# Patient Record
Sex: Female | Born: 1965 | Race: Black or African American | Hispanic: No | Marital: Married | Smoking: Never smoker
Health system: Southern US, Community
[De-identification: ages and names within clinical notes are randomized; demographics above are authoritative.]

## PROBLEM LIST (undated history)

## (undated) DIAGNOSIS — Z803 Family history of malignant neoplasm of breast: Secondary | ICD-10-CM

## (undated) HISTORY — DX: Family history of malignant neoplasm of breast: Z80.3

## (undated) HISTORY — PX: OTHER SURGICAL HISTORY: SHX169

---

## 2007-09-12 ENCOUNTER — Emergency Department: Payer: Self-pay | Admitting: Emergency Medicine

## 2010-02-06 ENCOUNTER — Ambulatory Visit: Payer: Self-pay | Admitting: Unknown Physician Specialty

## 2010-02-11 ENCOUNTER — Ambulatory Visit: Payer: Self-pay | Admitting: Unknown Physician Specialty

## 2010-02-13 LAB — PATHOLOGY REPORT

## 2011-08-18 ENCOUNTER — Ambulatory Visit: Payer: Self-pay | Admitting: Family Medicine

## 2011-11-27 ENCOUNTER — Emergency Department: Payer: Self-pay | Admitting: Emergency Medicine

## 2011-12-10 ENCOUNTER — Emergency Department: Payer: Self-pay | Admitting: Emergency Medicine

## 2012-12-06 ENCOUNTER — Ambulatory Visit: Payer: Self-pay | Admitting: Family Medicine

## 2014-01-25 ENCOUNTER — Ambulatory Visit: Payer: Self-pay | Admitting: Family Medicine

## 2015-02-13 ENCOUNTER — Telehealth: Payer: Self-pay | Admitting: Gastroenterology

## 2015-02-13 NOTE — Telephone Encounter (Signed)
Colonoscopy triage °

## 2015-02-20 ENCOUNTER — Other Ambulatory Visit: Payer: Self-pay

## 2015-02-20 NOTE — Telephone Encounter (Signed)
Gastroenterology Pre-Procedure Review  Request Date: 03-14-15 Requesting Physician: Dr.   PATIENT REVIEW QUESTIONS: The patient responded to the following health history questions as indicated:    1. Are you having any GI issues? no 2. Do you have a personal history of Polyps? no 3. Do you have a family history of Colon Cancer or Polyps? no 4. Diabetes Mellitus? no 5. Joint replacements in the past 12 months?no 6. Major health problems in the past 3 months?no 7. Any artificial heart valves, MVP, or defibrillator?no    MEDICATIONS & ALLERGIES:    Patient reports the following regarding taking any anticoagulation/antiplatelet therapy:   Plavix, Coumadin, Eliquis, Xarelto, Lovenox, Pradaxa, Brilinta, or Effient? no Aspirin? no  Patient confirms/reports the following medications:  No current outpatient prescriptions on file.   No current facility-administered medications for this visit.    Patient confirms/reports the following allergies:  No Known Allergies  No orders of the defined types were placed in this encounter.    AUTHORIZATION INFORMATION Primary Insurance: 1D#: Group #:  Secondary Insurance: 1D#: Group #:  SCHEDULE INFORMATION: Date: 03-14-15 Time: Location: [place]

## 2015-03-12 ENCOUNTER — Ambulatory Visit
Admission: RE | Admit: 2015-03-12 | Discharge: 2015-03-12 | Disposition: A | Discharge status: Home / Self Care | Payer: BC Managed Care – PPO | Source: Ambulatory Visit | Attending: Advanced Practice Midwife | Admitting: Advanced Practice Midwife

## 2015-03-12 ENCOUNTER — Other Ambulatory Visit: Payer: Self-pay | Admitting: Advanced Practice Midwife

## 2015-03-12 ENCOUNTER — Ambulatory Visit: Admission: RE | Admit: 2015-03-12 | Payer: BC Managed Care – PPO | Source: Ambulatory Visit

## 2015-03-12 DIAGNOSIS — Z1231 Encounter for screening mammogram for malignant neoplasm of breast: Secondary | ICD-10-CM

## 2015-03-12 NOTE — Discharge Instructions (Signed)

## 2015-03-14 ENCOUNTER — Ambulatory Visit: Payer: BC Managed Care – PPO | Admitting: Anesthesiology

## 2015-03-14 ENCOUNTER — Other Ambulatory Visit: Payer: Self-pay | Admitting: Gastroenterology

## 2015-03-14 ENCOUNTER — Encounter: Payer: Self-pay | Admitting: Anesthesiology

## 2015-03-14 ENCOUNTER — Encounter
Admission: RE | Disposition: A | Discharge status: Home / Self Care | Payer: Self-pay | Source: Ambulatory Visit | Attending: Gastroenterology

## 2015-03-14 ENCOUNTER — Ambulatory Visit
Admission: RE | Admit: 2015-03-14 | Discharge: 2015-03-14 | Disposition: A | Discharge status: Home / Self Care | Payer: BC Managed Care – PPO | Source: Ambulatory Visit | Attending: Gastroenterology | Admitting: Gastroenterology

## 2015-03-14 DIAGNOSIS — Z1211 Encounter for screening for malignant neoplasm of colon: Secondary | ICD-10-CM | POA: Insufficient documentation

## 2015-03-14 DIAGNOSIS — D125 Benign neoplasm of sigmoid colon: Secondary | ICD-10-CM

## 2015-03-14 DIAGNOSIS — K640 First degree hemorrhoids: Secondary | ICD-10-CM | POA: Insufficient documentation

## 2015-03-14 DIAGNOSIS — Z803 Family history of malignant neoplasm of breast: Secondary | ICD-10-CM | POA: Insufficient documentation

## 2015-03-14 HISTORY — PX: POLYPECTOMY: SHX149

## 2015-03-14 HISTORY — PX: COLONOSCOPY WITH PROPOFOL: SHX5780

## 2015-03-14 SURGERY — COLONOSCOPY WITH PROPOFOL
Anesthesia: Monitor Anesthesia Care | Wound class: Contaminated

## 2015-03-14 MED ORDER — LACTATED RINGERS IV SOLN
INTRAVENOUS | Status: DC
  Administered 2015-03-14 (×2): via INTRAVENOUS

## 2015-03-14 MED ORDER — OXYCODONE HCL 5 MG/5ML PO SOLN
5.0000 mg | Freq: Once | ORAL | Status: DC | PRN
Start: 2015-03-14 — End: 2015-03-14

## 2015-03-14 MED ORDER — PROPOFOL 10 MG/ML IV BOLUS
INTRAVENOUS | Status: DC | PRN
Start: 2015-03-14 — End: 2015-03-14
  Administered 2015-03-14 (×6): 20 mg via INTRAVENOUS

## 2015-03-14 MED ORDER — LIDOCAINE HCL (CARDIAC) 20 MG/ML IV SOLN
INTRAVENOUS | Status: DC | PRN
  Administered 2015-03-14: 30 mg via INTRAVENOUS

## 2015-03-14 MED ORDER — OXYCODONE HCL 5 MG PO TABS: 5.0000 mg | ORAL_TABLET | Freq: Once | ORAL | Status: DC | PRN

## 2015-03-14 MED ORDER — SODIUM CHLORIDE 0.9 % IV SOLN: INTRAVENOUS | Status: DC

## 2015-03-14 SURGICAL SUPPLY — 28 items
CANISTER SUCT 1200ML W/VALVE (MISCELLANEOUS) ×4 IMPLANT
FCP ESCP3.2XJMB 240X2.8X (MISCELLANEOUS)
FORCEPS BIOP RAD 4 LRG CAP 4 (CUTTING FORCEPS) IMPLANT
FORCEPS BIOP RJ4 240 W/NDL (MISCELLANEOUS)
FORCEPS ESCP3.2XJMB 240X2.8X (MISCELLANEOUS) IMPLANT
GOWN CVR UNV OPN BCK APRN NK (MISCELLANEOUS) ×4 IMPLANT
GOWN ISOL THUMB LOOP REG UNIV (MISCELLANEOUS) ×4
HEMOCLIP INSTINCT (CLIP) IMPLANT
INJECTOR VARIJECT VIN23 (MISCELLANEOUS) IMPLANT
KIT CO2 TUBING (TUBING) IMPLANT
KIT DEFENDO VALVE AND CONN (KITS) IMPLANT
KIT ENDO PROCEDURE OLY (KITS) ×4 IMPLANT
LIGATOR MULTIBAND 6SHOOTER MBL (MISCELLANEOUS) IMPLANT
MARKER SPOT ENDO TATTOO 5ML (MISCELLANEOUS) IMPLANT
PAD GROUND ADULT SPLIT (MISCELLANEOUS) IMPLANT
SNARE SHORT THROW 13M SML OVAL (MISCELLANEOUS) IMPLANT
SNARE SHORT THROW 30M LRG OVAL (MISCELLANEOUS) IMPLANT
SPOT EX ENDOSCOPIC TATTOO (MISCELLANEOUS)
SUCTION POLY TRAP 4CHAMBER (MISCELLANEOUS) IMPLANT
TRAP SUCTION POLY (MISCELLANEOUS) IMPLANT
TUBING CONN 6MMX3.1M (TUBING)
TUBING SUCTION CONN 0.25 STRL (TUBING) IMPLANT
UNDERPAD 30X60 958B10 (PK) (MISCELLANEOUS) IMPLANT
VALVE BIOPSY ENDO (VALVE) IMPLANT
VARIJECT INJECTOR VIN23 (MISCELLANEOUS)
WATER AUXILLARY (MISCELLANEOUS) IMPLANT
WATER STERILE IRR 250ML POUR (IV SOLUTION) ×4 IMPLANT
WATER STERILE IRR 500ML POUR (IV SOLUTION) IMPLANT

## 2015-03-14 NOTE — Anesthesia Postprocedure Evaluation (Signed)
  Anesthesia Post-op Note  Patient: Virginia Pruitt  Procedure(s) Performed: Procedure(s) with comments: COLONOSCOPY WITH PROPOFOL (N/A) POLYPECTOMY INTESTINAL - sigmoid colon polyp  Anesthesia type:MAC  Patient location: PACU  Post pain: Pain level controlled  Post assessment: Post-op Vital signs reviewed, Patient's Cardiovascular Status Stable, Respiratory Function Stable, Patent Airway and No signs of Nausea or vomiting  Post vital signs: Reviewed and stable  Last Vitals:  Filed Vitals:   03/14/15 1044  BP: 99/79  Pulse: 90  Temp:   Resp: 17    Level of consciousness: awake, alert  and patient cooperative  Complications: No apparent anesthesia complications

## 2015-03-14 NOTE — Anesthesia Preprocedure Evaluation (Addendum)
Anesthesia Evaluation  Patient identified by MRN, date of birth, ID band  Reviewed: NPO status   History of Anesthesia Complications Negative for: history of anesthetic complications  Airway Mallampati: II  TM Distance: >3 FB Neck ROM: full    Dental no notable dental hx.    Pulmonary neg pulmonary ROS,    Pulmonary exam normal       Cardiovascular Exercise Tolerance: Good negative cardio ROS Normal cardiovascular exam    Neuro/Psych negative neurological ROS  negative psych ROS   GI/Hepatic negative GI ROS, Neg liver ROS,   Endo/Other  negative endocrine ROS  Renal/GU negative Renal ROS  negative genitourinary   Musculoskeletal   Abdominal   Peds  Hematology negative hematology ROS (+)   Anesthesia Other Findings   Reproductive/Obstetrics negative OB ROS                            Anesthesia Physical Anesthesia Plan  ASA: I  Anesthesia Plan: MAC   Post-op Pain Management:    Induction:   Airway Management Planned:   Additional Equipment:   Intra-op Plan:   Post-operative Plan:   Informed Consent: I have reviewed the patients History and Physical, chart, labs and discussed the procedure including the risks, benefits and alternatives for the proposed anesthesia with the patient or authorized representative who has indicated his/her understanding and acceptance.     Plan Discussed with: CRNA  Anesthesia Plan Comments:        Anesthesia Quick Evaluation

## 2015-03-14 NOTE — H&P (Signed)
  [facility]  [address], [place], [place] [address] Phone: [phone] Fax : [phone]  Primary Care Physician:  Burlene Arnt, CNM Primary Gastroenterologist:  Dr. Allen Norris  Pre-Procedure History & Physical: HPI:  Virginia Pruitt is a 49 y.o. female is here for a screening colonoscopy.   No past medical history on file.  Past Surgical History  Procedure Laterality Date  . Vaginal polyp removed      Prior to Admission medications   Not on File    Allergies as of 02/20/2015  . (No Known Allergies)    Family History  Problem Relation Age of Onset  . Breast cancer Sister 31    History   Social History  . Marital Status: Married    Spouse Name: N/A  . Number of Children: N/A  . Years of Education: N/A   Occupational History  . Not on file.   Social History Main Topics  . Smoking status: Never Smoker   . Smokeless tobacco: Not on file  . Alcohol Use: No  . Drug Use: Not on file  . Sexual Activity: Not on file   Other Topics Concern  . Not on file   Social History Narrative    Review of Systems: See HPI, otherwise negative ROS  Physical Exam: BP 118/86 mmHg  Pulse 82  Temp(Src) 97.7 F (36.5 C) (Temporal)  Resp 16  Ht 5\' 8"  (1.727 m)  Wt 160 lb (72.576 kg)  BMI 24.33 kg/m2  SpO2 98% General:   Alert,  pleasant and cooperative in NAD Head:  Normocephalic and atraumatic. Neck:  Supple; no masses or thyromegaly. Lungs:  Clear throughout to auscultation.    Heart:  Regular rate and rhythm. Abdomen:  Soft, nontender and nondistended. Normal bowel sounds, without guarding, and without rebound.   Neurologic:  Alert and  oriented x4;  grossly normal neurologically.  Impression/Plan: Virginia Pruitt is now here to undergo a screening colonoscopy.  Risks, benefits, and alternatives regarding colonoscopy have been reviewed with the patient.  Questions have been answered.  All parties agreeable.

## 2015-03-14 NOTE — Op Note (Signed)
[facility] Patient Name: Virginia Pruitt Procedure Date: 03/14/2015 10:09 AM MRN: [id number] Account #: [id number] Date of Birth: 1966-04-30 Admit Type: Outpatient Age: 49 Room: [facility] OR ROOM 01 Gender: Female Note Status: Finalized Procedure:         Colonoscopy Indications:       Screening for colorectal malignant neoplasm Providers:         Lucilla Lame, MD Referring MD:      Rulon Sera. Brothers (Referring MD) Medicines:         Propofol per Anesthesia Complications:     No immediate complications. Procedure:         Pre-Anesthesia Assessment:                    - Prior to the procedure, a History and Physical was                     performed, and patient medications and allergies were                     reviewed. The patient's tolerance of previous anesthesia                     was also reviewed. The risks and benefits of the procedure                     and the sedation options and risks were discussed with the                     patient. All questions were answered, and informed consent                     was obtained. Prior Anticoagulants: The patient has taken                     no previous anticoagulant or antiplatelet agents. ASA                     Grade Assessment: II - A patient with mild systemic                     disease. After reviewing the risks and benefits, the                     patient was deemed in satisfactory condition to undergo                     the procedure.                    After obtaining informed consent, the colonoscope was                     passed under direct vision. Throughout the procedure, the                     patient's blood pressure, pulse, and oxygen saturations                     were monitored continuously. The was introduced through                     the anus and advanced to the the cecum, identified by  appendiceal orifice and ileocecal valve. The colonoscopy                was performed without difficulty. The patient tolerated                     the procedure well. The quality of the bowel preparation                     was excellent. Findings:      The perianal and digital rectal examinations were normal.      A 3 mm polyp was found in the sigmoid colon. The polyp was sessile. The       polyp was removed with a cold biopsy forceps. Resection and retrieval       were complete.      Non-bleeding internal hemorrhoids were found during retroflexion. The       hemorrhoids were Grade I (internal hemorrhoids that do not prolapse). Impression:        - One 3 mm polyp in the sigmoid colon. Resected and                     retrieved.                    - Non-bleeding internal hemorrhoids. Recommendation:    - Await pathology results.                    - Repeat colonoscopy in 5 years if polyp adenoma and 10                     years if hyperplastic Procedure Code(s): --- Professional ---                    [phone], Colonoscopy, flexible; with biopsy, single or                     multiple Diagnosis Code(s): --- Professional ---                    Z12.11, Encounter for screening for malignant neoplasm of                     colon                    D12.5, Benign neoplasm of sigmoid colon CPT copyright 2014 American Medical Association. All rights reserved. The codes documented in this report are preliminary and upon coder review may  be revised to meet current compliance requirements. Lucilla Lame, MD 03/14/2015 10:32:06 AM This report has been signed electronically. Number of Addenda: 0 Note Initiated On: 03/14/2015 10:09 AM Scope Withdrawal Time: 0 hours 6 minutes 39 seconds  Total Procedure Duration: 0 hours 9 minutes 1 second       [facility]

## 2015-03-14 NOTE — Transfer of Care (Signed)
Immediate Anesthesia Transfer of Care Note  Patient: Virginia Pruitt  Procedure(s) Performed: Procedure(s) with comments: COLONOSCOPY WITH PROPOFOL (N/A) POLYPECTOMY INTESTINAL - sigmoid colon polyp  Patient Location: PACU  Anesthesia Type: MAC  Level of Consciousness: awake, alert  and patient cooperative  Airway and Oxygen Therapy: Patient Spontanous Breathing and Patient connected to supplemental oxygen  Post-op Assessment: Post-op Vital signs reviewed, Patient's Cardiovascular Status Stable, Respiratory Function Stable, Patent Airway and No signs of Nausea or vomiting  Post-op Vital Signs: Reviewed and stable  Complications: No apparent anesthesia complications

## 2015-03-17 ENCOUNTER — Encounter: Payer: Self-pay | Admitting: Gastroenterology

## 2015-03-18 ENCOUNTER — Encounter: Payer: Self-pay | Admitting: Gastroenterology

## 2016-02-02 ENCOUNTER — Other Ambulatory Visit: Payer: Self-pay | Admitting: Advanced Practice Midwife

## 2016-02-02 DIAGNOSIS — Z1231 Encounter for screening mammogram for malignant neoplasm of breast: Secondary | ICD-10-CM

## 2016-03-15 ENCOUNTER — Ambulatory Visit
Admission: RE | Admit: 2016-03-15 | Discharge: 2016-03-15 | Disposition: A | Discharge status: Home / Self Care | Payer: BC Managed Care – PPO | Source: Ambulatory Visit | Attending: Advanced Practice Midwife | Admitting: Advanced Practice Midwife

## 2016-03-15 ENCOUNTER — Other Ambulatory Visit: Payer: Self-pay | Admitting: Advanced Practice Midwife

## 2016-03-15 DIAGNOSIS — Z1231 Encounter for screening mammogram for malignant neoplasm of breast: Secondary | ICD-10-CM

## 2016-07-20 ENCOUNTER — Ambulatory Visit
Admission: EM | Admit: 2016-07-20 | Discharge: 2016-07-20 | Disposition: A | Discharge status: Home / Self Care | Payer: BC Managed Care – PPO | Attending: Family Medicine | Admitting: Family Medicine

## 2016-07-20 ENCOUNTER — Encounter: Payer: Self-pay | Admitting: Emergency Medicine

## 2016-07-20 DIAGNOSIS — L03114 Cellulitis of left upper limb: Secondary | ICD-10-CM

## 2016-07-20 MED ORDER — RANITIDINE HCL 150 MG PO CAPS: 150.0000 mg | ORAL_CAPSULE | Freq: Two times a day (BID) | ORAL | 0 refills | Status: DC | PRN

## 2016-07-20 MED ORDER — CEPHALEXIN 500 MG PO CAPS: 500.0000 mg | ORAL_CAPSULE | Freq: Three times a day (TID) | ORAL | 0 refills | Status: AC

## 2016-07-20 NOTE — Discharge Instructions (Signed)
Recommend start Keflex 3 times a day as directed. May take Zantac 150mg  twice a day as needed for itching. Apply cool compresses to area for comfort. Follow-up with your primary care provider in 2 to 3 days if not improving.

## 2016-07-20 NOTE — ED Provider Notes (Signed)
CSN: [id number]     Arrival date & time 07/20/16  1922 History   First MD Initiated Contact with Patient 07/20/16 2008     Chief Complaint  Patient presents with  . Hand Problem  . Pruritis   (Consider location/radiation/quality/duration/timing/severity/associated sxs/prior Treatment) 50 year old female presents with left hand swelling, redness and itching for the past 3 to 4 days. Has a few "bug bites"/lesions on her left fingers and uncertain if they have become infected and causing the reaction/swelling. Noticed redness and swelling have increased from base of hand to past her wrist in the past 24 hours. Minimal pain. Applied an ice pack with some relief. Took Zyrtec for itching with minimal relief. Unable to take Benadryl due to reaction (makes her feel "crazy, anxious and sleepy"). No history of chronic health issues. Takes no other daily medication.    The history is provided by the patient.    History reviewed. No pertinent past medical history. Past Surgical History:  Procedure Laterality Date  . COLONOSCOPY WITH PROPOFOL N/A 03/14/2015   Procedure: COLONOSCOPY WITH PROPOFOL;  Surgeon: Lucilla Lame, MD;  Location: [place];  Service: Endoscopy;  Laterality: N/A;  . POLYPECTOMY  03/14/2015   Procedure: POLYPECTOMY INTESTINAL;  Surgeon: Lucilla Lame, MD;  Location: [place];  Service: Endoscopy;;  sigmoid colon polyp  . vaginal polyp removed     Family History  Problem Relation Age of Onset  . Breast cancer Sister 26   Social History  Substance Use Topics  . Smoking status: Never Smoker  . Smokeless tobacco: Never Used  . Alcohol use No   OB History    No data available     Review of Systems  Constitutional: Negative for chills, fatigue and fever.  HENT: Negative for congestion.   Eyes: Negative for discharge.  Respiratory: Negative for cough, chest tightness, shortness of breath and wheezing.   Musculoskeletal: Negative for back pain, neck pain and  neck stiffness.  Skin: Positive for rash.  Neurological: Negative for dizziness, syncope, weakness, light-headedness, numbness and headaches.  Hematological: Negative for adenopathy.    Allergies  Patient has no known allergies.  Home Medications   Prior to Admission medications   Medication Sig Start Date End Date Taking? Authorizing Provider  cephALEXin (KEFLEX) 500 MG capsule Take 1 capsule (500 mg total) by mouth 3 (three) times daily. 07/20/16 07/27/16  Katy Apo, NP  ranitidine (ZANTAC) 150 MG capsule Take 1 capsule (150 mg total) by mouth 2 (two) times daily as needed (for itching). 07/20/16   Katy Apo, NP   Meds Ordered and Administered this Visit  Medications - No data to display  BP 121/76 (BP Location: Left Arm)   Pulse 79   Temp 97.8 F (36.6 C) (Tympanic)   Resp 16   Ht 5\' 8"  (1.727 m)   Wt 169 lb (76.7 kg)   SpO2 100%   BMI 25.70 kg/m  No data found.   Physical Exam  Constitutional: She is oriented to person, place, and time. She appears well-developed and well-nourished. No distress.  HENT:  Head: Normocephalic and atraumatic.  Right Ear: Hearing, tympanic membrane, external ear and ear canal normal.  Left Ear: Hearing, tympanic membrane, external ear and ear canal normal.  Nose: No rhinorrhea. Right sinus exhibits no maxillary sinus tenderness and no frontal sinus tenderness. Left sinus exhibits no maxillary sinus tenderness and no frontal sinus tenderness.  Mouth/Throat: Oropharynx is clear and moist.  Cardiovascular: Normal rate, regular  rhythm and normal heart sounds.   Pulmonary/Chest: Effort normal and breath sounds normal. No respiratory distress. She has no wheezes. She has no rales.  Musculoskeletal: Normal range of motion. She exhibits edema.       Left hand: She exhibits swelling. She exhibits normal range of motion, no tenderness, normal capillary refill, no deformity and no laceration. Normal sensation noted. Normal strength noted.         Hands: 3 raised papular lesions present on left fingers and hand. Erythema and swelling surrounding area, extending from tip of fingers to about 1 inch past wrist. Non-tender. Has full range of motion. Pulses are normal and equal bilaterally. Good capillary refill. No neuro deficits noted.   Neurological: She is alert and oriented to person, place, and time.  Skin: Skin is warm and dry. Capillary refill takes less than 2 seconds. There is erythema.  Psychiatric: She has a normal mood and affect. Her behavior is normal. Judgment and thought content normal.    Urgent Care Course   Clinical Course     Procedures (including critical care time)  Labs Review Labs Reviewed - No data to display  Imaging Review No results found.   Visual Acuity Review  Right Eye Distance:   Left Eye Distance:   Bilateral Distance:    Right Eye Near:   Left Eye Near:    Bilateral Near:         MDM   1. Cellulitis of left hand    Discussed that she has a mild cellulitis of her hand. Recommend start Keflex 500mg  3 times a day as directed. May take Zantac 150mg  twice a day as needed for itching. Apply cool compresses to area for comfort. Follow-up with her primary care provider in 3 days if not improving.     Katy Apo, NP 07/20/16 [phone]

## 2016-07-20 NOTE — ED Triage Notes (Signed)
Patient c/o left hand swelling and redness that started Sunday.  Patient reports itching in her fingers.

## 2017-03-04 ENCOUNTER — Encounter: Payer: Self-pay | Admitting: Advanced Practice Midwife

## 2017-03-04 ENCOUNTER — Ambulatory Visit (INDEPENDENT_AMBULATORY_CARE_PROVIDER_SITE_OTHER): Payer: BC Managed Care – PPO | Admitting: Advanced Practice Midwife

## 2017-03-04 VITALS — BP 124/80 | Ht 68.0 in | Wt 170.0 lb

## 2017-03-04 DIAGNOSIS — Z124 Encounter for screening for malignant neoplasm of cervix: Secondary | ICD-10-CM

## 2017-03-04 DIAGNOSIS — Z01419 Encounter for gynecological examination (general) (routine) without abnormal findings: Secondary | ICD-10-CM | POA: Diagnosis not present

## 2017-03-04 NOTE — Progress Notes (Signed)
Patient ID: Virginia Pruitt, female   DOB: [birth year], 51 y.o.   MRN: [id number]     Gynecology Annual Exam  PCP: Burlene Arnt, CNM  Chief Complaint:  Chief Complaint  Patient presents with  . Annual Exam    History of Present Illness:Patient is a 51 y.o. G3P3003 presents for annual exam. The patient has no complaints today.   LMP: No LMP recorded. Patient is postmenopausal. Postcoital Bleeding: no Dysmenorrhea: not applicable   The patient is sexually active. She denies dyspareunia.  The patient does perform self breast exams.  There is no notable family history of breast or ovarian cancer in her family.  The patient wears seatbelts: yes.   The patient has regular exercise: yes.    The patient denies current symptoms of depression.     Review of Systems: Review of Systems  Constitutional: Negative.   HENT: Negative.   Eyes: Negative.   Respiratory: Negative.   Cardiovascular: Negative.   Gastrointestinal: Negative.   Genitourinary: Negative.   Musculoskeletal: Negative.   Skin: Negative.   Neurological: Negative.   Endo/Heme/Allergies: Negative.   Psychiatric/Behavioral: Negative.     Past Medical History:  History reviewed. No pertinent past medical history.  Past Surgical History:  Past Surgical History:  Procedure Laterality Date  . COLONOSCOPY WITH PROPOFOL N/A 03/14/2015   Procedure: COLONOSCOPY WITH PROPOFOL;  Surgeon: Lucilla Lame, MD;  Location: [place];  Service: Endoscopy;  Laterality: N/A;  . POLYPECTOMY  03/14/2015   Procedure: POLYPECTOMY INTESTINAL;  Surgeon: Lucilla Lame, MD;  Location: [place];  Service: Endoscopy;;  sigmoid colon polyp  . vaginal polyp removed      Gynecologic History:  No LMP recorded. Patient is postmenopausal. Last Pap: Results were: 2 years ago no abnormalities  Last mammogram: 1 year ago Results were: BI-RAD I Obstetric History: [id number]  Family History:  Family History  Problem Relation  Age of Onset  . Breast cancer Sister 23    Social History:  Social History   Social History  . Marital status: Married    Spouse name: N/A  . Number of children: N/A  . Years of education: N/A   Occupational History  . Not on file.   Social History Main Topics  . Smoking status: Never Smoker  . Smokeless tobacco: Never Used  . Alcohol use No  . Drug use: Unknown  . Sexual activity: Yes    Birth control/ protection: Post-menopausal   Other Topics Concern  . Not on file   Social History Narrative  . No narrative on file    Allergies:  No Known Allergies  Medications: Prior to Admission medications   Medication Sig Start Date End Date Taking? Authorizing Provider  ranitidine (ZANTAC) 150 MG capsule Take 1 capsule (150 mg total) by mouth 2 (two) times daily as needed (for itching). Patient not taking: Reported on 03/04/2017 07/20/16   Katy Apo, NP    Physical Exam Vitals: Blood pressure 124/80, height 5\' 8"  (1.727 m), weight 170 lb (77.1 kg).  General: NAD HEENT: normocephalic, anicteric Thyroid: no enlargement, no palpable nodules Pulmonary: No increased work of breathing, CTAB Cardiovascular: RRR, distal pulses 2+ Breast: Breast symmetrical, no tenderness, no palpable nodules or masses, no skin or nipple retraction present, no nipple discharge.  No axillary or supraclavicular lymphadenopathy. Abdomen: NABS, soft, non-tender, non-distended.  Umbilicus without lesions.  No hepatomegaly, splenomegaly or masses palpable. No evidence of hernia  Genitourinary:  External: Normal external female genitalia.  Normal  urethral meatus, normal  Bartholin's and Skene's glands.    Vagina: Normal vaginal mucosa, no evidence of prolapse.    Cervix: Grossly normal in appearance, no bleeding, no CMT  Uterus: Non-enlarged, mobile, normal contour.    Adnexa: ovaries non-enlarged, no adnexal masses  Rectal: deferred  Lymphatic: no evidence of inguinal  lymphadenopathy Extremities: no edema, erythema, or tenderness Neurologic: Grossly intact Psychiatric: mood appropriate, affect full   Assessment: 51 y.o. P0L4103 Well Woman exam with PAP smear  Plan: Problem List Items Addressed This Visit    None    Visit Diagnoses    Well woman exam with routine gynecological exam    -  Primary   Relevant Orders   IGP, Aptima HPV   Cervical cancer screening       Relevant Orders   IGP, Aptima HPV      1) Mammogram - recommend yearly screening mammogram.  Mammogram appointment scheduled  2) Continue healthy lifestyle diet and exercise  3) ASCCP guidelines and rational discussed.  Patient opts for every 2-3 years screening interval  4) Osteoporosis  - per USPTF routine screening DEXA at age 51  5) Routine healthcare maintenance including cholesterol, diabetes screening discussed managed by PCP  6) Colonoscopy: Up to date.  Screening recommended starting at age 53 for average risk individuals, age 53 for individuals deemed at increased risk (including African Americans) and recommended to continue until age 65.  For patient age 35-85 individualized approach is recommended.  Gold standard screening is via colonoscopy, Cologuard screening is an acceptable alternative for patient unwilling or unable to undergo colonoscopy.  "Colorectal cancer screening for average?risk adults: 2018 guideline update from the American Cancer Society"[place]: A Cancer Journal for Clinicians: Jan 05, 2017   7) Follow up 1 year for routine annual   Rod Can, [place]

## 2017-03-08 LAB — IGP, APTIMA HPV
HPV Aptima: NEGATIVE
PAP Smear Comment: 0

## 2017-04-19 ENCOUNTER — Other Ambulatory Visit: Payer: Self-pay | Admitting: Advanced Practice Midwife

## 2017-04-19 DIAGNOSIS — Z1231 Encounter for screening mammogram for malignant neoplasm of breast: Secondary | ICD-10-CM

## 2017-06-07 ENCOUNTER — Ambulatory Visit: Payer: BC Managed Care – PPO

## 2017-06-29 ENCOUNTER — Ambulatory Visit
Admission: RE | Admit: 2017-06-29 | Discharge: 2017-06-29 | Disposition: A | Discharge status: Home / Self Care | Payer: BC Managed Care – PPO | Source: Ambulatory Visit | Attending: Advanced Practice Midwife | Admitting: Advanced Practice Midwife

## 2017-06-29 ENCOUNTER — Ambulatory Visit: Payer: BC Managed Care – PPO

## 2017-06-29 DIAGNOSIS — Z1231 Encounter for screening mammogram for malignant neoplasm of breast: Secondary | ICD-10-CM | POA: Insufficient documentation

## 2018-06-01 ENCOUNTER — Other Ambulatory Visit: Payer: Self-pay | Admitting: Advanced Practice Midwife

## 2018-09-11 ENCOUNTER — Other Ambulatory Visit: Payer: Self-pay | Admitting: Advanced Practice Midwife

## 2018-09-11 DIAGNOSIS — Z1231 Encounter for screening mammogram for malignant neoplasm of breast: Secondary | ICD-10-CM

## 2018-09-25 ENCOUNTER — Ambulatory Visit
Admission: RE | Admit: 2018-09-25 | Discharge: 2018-09-25 | Disposition: A | Discharge status: Home / Self Care | Payer: BC Managed Care – PPO | Source: Ambulatory Visit | Attending: Advanced Practice Midwife | Admitting: Advanced Practice Midwife

## 2018-09-25 DIAGNOSIS — Z1231 Encounter for screening mammogram for malignant neoplasm of breast: Secondary | ICD-10-CM | POA: Diagnosis not present

## 2019-09-04 ENCOUNTER — Other Ambulatory Visit: Payer: Self-pay | Admitting: Advanced Practice Midwife

## 2019-09-04 DIAGNOSIS — Z1231 Encounter for screening mammogram for malignant neoplasm of breast: Secondary | ICD-10-CM

## 2019-10-01 ENCOUNTER — Ambulatory Visit
Admission: RE | Admit: 2019-10-01 | Discharge: 2019-10-01 | Disposition: A | Discharge status: Home / Self Care | Payer: BC Managed Care – PPO | Source: Ambulatory Visit | Attending: Advanced Practice Midwife | Admitting: Advanced Practice Midwife

## 2019-10-01 ENCOUNTER — Other Ambulatory Visit: Payer: Self-pay

## 2019-10-01 DIAGNOSIS — Z1231 Encounter for screening mammogram for malignant neoplasm of breast: Secondary | ICD-10-CM | POA: Diagnosis not present

## 2020-09-11 ENCOUNTER — Other Ambulatory Visit: Payer: Self-pay | Admitting: Advanced Practice Midwife

## 2020-09-11 DIAGNOSIS — Z1231 Encounter for screening mammogram for malignant neoplasm of breast: Secondary | ICD-10-CM

## 2020-10-01 ENCOUNTER — Ambulatory Visit: Payer: BC Managed Care – PPO

## 2020-10-06 ENCOUNTER — Other Ambulatory Visit: Payer: Self-pay

## 2020-10-06 ENCOUNTER — Ambulatory Visit
Admission: RE | Admit: 2020-10-06 | Discharge: 2020-10-06 | Disposition: A | Discharge status: Home / Self Care | Payer: BC Managed Care – PPO | Source: Ambulatory Visit | Attending: Advanced Practice Midwife | Admitting: Advanced Practice Midwife

## 2020-10-06 DIAGNOSIS — Z1231 Encounter for screening mammogram for malignant neoplasm of breast: Secondary | ICD-10-CM | POA: Insufficient documentation

## 2020-10-15 ENCOUNTER — Other Ambulatory Visit (HOSPITAL_COMMUNITY)
Admission: RE | Admit: 2020-10-15 | Discharge: 2020-10-15 | Disposition: A | Discharge status: Home / Self Care | Payer: BC Managed Care – PPO | Source: Ambulatory Visit | Attending: Advanced Practice Midwife | Admitting: Advanced Practice Midwife

## 2020-10-15 ENCOUNTER — Ambulatory Visit (INDEPENDENT_AMBULATORY_CARE_PROVIDER_SITE_OTHER): Payer: BC Managed Care – PPO | Admitting: Advanced Practice Midwife

## 2020-10-15 ENCOUNTER — Other Ambulatory Visit: Payer: Self-pay

## 2020-10-15 ENCOUNTER — Encounter: Payer: Self-pay | Admitting: Advanced Practice Midwife

## 2020-10-15 VITALS — BP 124/78 | Ht 68.5 in | Wt 181.0 lb

## 2020-10-15 DIAGNOSIS — Z01419 Encounter for gynecological examination (general) (routine) without abnormal findings: Secondary | ICD-10-CM | POA: Insufficient documentation

## 2020-10-15 DIAGNOSIS — Z1239 Encounter for other screening for malignant neoplasm of breast: Secondary | ICD-10-CM

## 2020-10-15 DIAGNOSIS — Z23 Encounter for immunization: Secondary | ICD-10-CM | POA: Diagnosis not present

## 2020-10-15 DIAGNOSIS — Z124 Encounter for screening for malignant neoplasm of cervix: Secondary | ICD-10-CM | POA: Insufficient documentation

## 2020-10-15 NOTE — Progress Notes (Addendum)
Gynecology Annual Exam  PCP: Rod Can, CNM  Chief Complaint:  Chief Complaint  Patient presents with  . Gynecologic Exam    Annual - no concerns. RM 3    History of Present Illness:Patient is a 55 y.o. G3P3003 presents for annual exam. The patient has no complaints today. She is here for a PAP smear today. She had a normal mammogram a few weeks ago.   LMP: No LMP recorded. Patient is postmenopausal.   The patient is not currently sexually active. She denies dyspareunia.  The patient does perform self breast exams.  There is notable family history of breast or ovarian cancer in her family. Her sister was diagnosed with breast cancer in her 5's. Another sister had genetic screening and was BRCA negative. Patient declines genetic screening.  The patient wears seatbelts: yes.   The patient has regular exercise: she walks and works out in gym daily, she admits healthy diet, adequate hydration and sleep.    The patient denies current symptoms of depression.     Review of Systems: Review of Systems  Constitutional: Negative for chills and fever.  HENT: Negative for congestion, ear discharge, ear pain, hearing loss, sinus pain and sore throat.   Eyes: Negative for blurred vision and double vision.  Respiratory: Negative for cough, shortness of breath and wheezing.   Cardiovascular: Negative for chest pain, palpitations and leg swelling.  Gastrointestinal: Negative for abdominal pain, blood in stool, constipation, diarrhea, heartburn, melena, nausea and vomiting.  Genitourinary: Negative for dysuria, flank pain, frequency, hematuria and urgency.  Musculoskeletal: Negative for back pain, joint pain and myalgias.  Skin: Negative for itching and rash.  Neurological: Negative for dizziness, tingling, tremors, sensory change, speech change, focal weakness, seizures, loss of consciousness, weakness and headaches.  Endo/Heme/Allergies: Negative for environmental allergies. Does not  bruise/bleed easily.  Psychiatric/Behavioral: Negative for depression, hallucinations, memory loss, substance abuse and suicidal ideas. The patient is not nervous/anxious and does not have insomnia.     Past Medical History:  Patient Active Problem List   Diagnosis Date Noted  . Benign neoplasm of sigmoid colon     Past Surgical History:  Past Surgical History:  Procedure Laterality Date  . COLONOSCOPY WITH PROPOFOL N/A 03/14/2015   Procedure: COLONOSCOPY WITH PROPOFOL;  Surgeon: Lucilla Lame, MD;  Location: [place];  Service: Endoscopy;  Laterality: N/A;  . POLYPECTOMY  03/14/2015   Procedure: POLYPECTOMY INTESTINAL;  Surgeon: Lucilla Lame, MD;  Location: [place];  Service: Endoscopy;;  sigmoid colon polyp  . vaginal polyp removed      Gynecologic History:  No LMP recorded. Patient is postmenopausal. Last Pap: 2018 Results were:  no abnormalities  Last mammogram: 2 weeks ago Results were: BI-RAD I  Obstetric History: [id number]  Family History:  Family History  Problem Relation Age of Onset  . Breast cancer Sister 71  . Hypertension Mother     Social History:  Social History   Socioeconomic History  . Marital status: Married    Spouse name: Not on file  . Number of children: Not on file  . Years of education: Not on file  . Highest education level: Not on file  Occupational History  . Not on file  Tobacco Use  . Smoking status: Never Smoker  . Smokeless tobacco: Never Used  Vaping Use  . Vaping Use: Never used  Substance and Sexual Activity  . Alcohol use: No  . Drug use: Never  . Sexual activity: Yes  Birth control/protection: Post-menopausal  Other Topics Concern  . Not on file  Social History Narrative  . Not on file   Social Determinants of Health   Financial Resource Strain: Not on file  Food Insecurity: Not on file  Transportation Needs: Not on file  Physical Activity: Not on file  Stress: Not on file  Social Connections: Not  on file  Intimate Partner Violence: Not on file    Allergies:  Allergies  Allergen Reactions  . Amoxicillin Rash    Medications: Prior to Admission medications   Medication Sig Start Date End Date Taking? Authorizing Provider  ranitidine (ZANTAC) 150 MG capsule Take 1 capsule (150 mg total) by mouth 2 (two) times daily as needed (for itching). Patient not taking: Reported on 03/04/2017 07/20/16   Katy Apo, NP    Physical Exam Vitals: Blood pressure 124/78, height 5' 8.5" (1.74 m), weight 181 lb (82.1 kg).  General: NAD HEENT: normocephalic, anicteric Thyroid: no enlargement, no palpable nodules Pulmonary: No increased work of breathing, CTAB Cardiovascular: RRR, distal pulses 2+ Breast: Breast symmetrical, no tenderness, no palpable nodules or masses, no skin or nipple retraction present, no nipple discharge.  No axillary or supraclavicular lymphadenopathy. Abdomen: NABS, soft, non-tender, non-distended.  Umbilicus without lesions.  No hepatomegaly, splenomegaly or masses palpable. No evidence of hernia  Genitourinary:  External: Normal external female genitalia.  Normal urethral meatus, normal Bartholin's and Skene's glands.    Vagina: Normal vaginal mucosa, no evidence of prolapse.    Cervix: Grossly normal in appearance, no bleeding, no CMT  Uterus: deferred    Adnexa: deferred  Rectal: deferred  Lymphatic: no evidence of inguinal lymphadenopathy Extremities: no edema, erythema, or tenderness Neurologic: Grossly intact Psychiatric: mood appropriate, affect full    Assessment: 55 y.o. G3P3003 routine annual exam  Plan: Problem List Items Addressed This Visit   None   Visit Diagnoses    Well woman exam with routine gynecological exam    -  Primary   Relevant Orders   Cytology - PAP   Screening for cervical cancer       Relevant Orders   Cytology - PAP      1) Mammogram - recommend yearly screening mammogram.  Mammogram Is up to date. Order placed for  mammogram- 1 year from now.  2) STI screening  was not offered and therefore not obtained  3) ASCCP guidelines and rationale discussed.  Patient opts for every 3-5 years screening interval  4) Osteoporosis  - per USPTF routine screening DEXA at age 65  Consider FDA-approved medical therapies in postmenopausal women and men aged 70 years and older, based on the following: a) A hip or vertebral (clinical or morphometric) fracture b) T-score ? -2.5 at the femoral neck or spine after appropriate evaluation to exclude secondary causes C) Low bone mass (T-score between -1.0 and -2.5 at the femoral neck or spine) and a 10-year probability of a hip fracture ? 3% or a 10-year probability of a major osteoporosis-related fracture ? 20% based on the [place]-adapted WHO algorithm   5) Routine healthcare maintenance including cholesterol, diabetes screening discussed Declines  6) Colonoscopy is up to date and next due in 2026.  Screening recommended starting at age 54 for average risk individuals, age 74 for individuals deemed at increased risk (including African Americans) and recommended to continue until age 9.  For patient age 104-85 individualized approach is recommended.  Gold standard screening is via colonoscopy, Cologuard screening is an acceptable alternative for patient  unwilling or unable to undergo colonoscopy.  "Colorectal cancer screening for average?risk adults: 2018 guideline update from the American Cancer Society"CA: A Cancer Journal for Clinicians: Jan 05, 2017   7) TDAP booster given today  8) Return in about 1 year (around 10/15/2021) for annual established gyn.    Christean Leaf, CNM [facility] 10/15/20, 3:51 PM

## 2020-10-15 NOTE — Addendum Note (Signed)
Addended by: Tamala Julian R on: 10/15/2020 04:13 PM   Modules accepted: Orders

## 2020-10-20 LAB — CYTOLOGY - PAP
Comment: NEGATIVE
Diagnosis: NEGATIVE
High risk HPV: NEGATIVE

## 2020-10-21 ENCOUNTER — Encounter: Payer: Self-pay | Admitting: Obstetrics and Gynecology

## 2021-09-22 ENCOUNTER — Other Ambulatory Visit: Payer: Self-pay | Admitting: Advanced Practice Midwife

## 2021-09-22 DIAGNOSIS — Z1231 Encounter for screening mammogram for malignant neoplasm of breast: Secondary | ICD-10-CM

## 2021-11-18 ENCOUNTER — Ambulatory Visit: Payer: BC Managed Care – PPO

## 2021-12-23 ENCOUNTER — Ambulatory Visit
Admission: RE | Admit: 2021-12-23 | Discharge: 2021-12-23 | Disposition: A | Discharge status: Home / Self Care | Payer: BC Managed Care – PPO | Source: Ambulatory Visit | Attending: Advanced Practice Midwife | Admitting: Advanced Practice Midwife

## 2021-12-23 DIAGNOSIS — Z1231 Encounter for screening mammogram for malignant neoplasm of breast: Secondary | ICD-10-CM | POA: Insufficient documentation

## 2022-01-05 IMAGING — MG MM DIGITAL SCREENING BILAT W/ TOMO AND CAD
8 series · 8 of 24 positions shown · non-contrast
Comparison: Previous exam(s).

CLINICAL DATA: Screening.

EXAM:
DIGITAL SCREENING BILATERAL MAMMOGRAM WITH TOMOSYNTHESIS AND CAD
TECHNIQUE: Bilateral screening digital craniocaudal and mediolateral oblique
mammograms were obtained. Bilateral screening digital breast
tomosynthesis was performed. The images were evaluated with
computer-aided detection.

[R CC synth-2D]
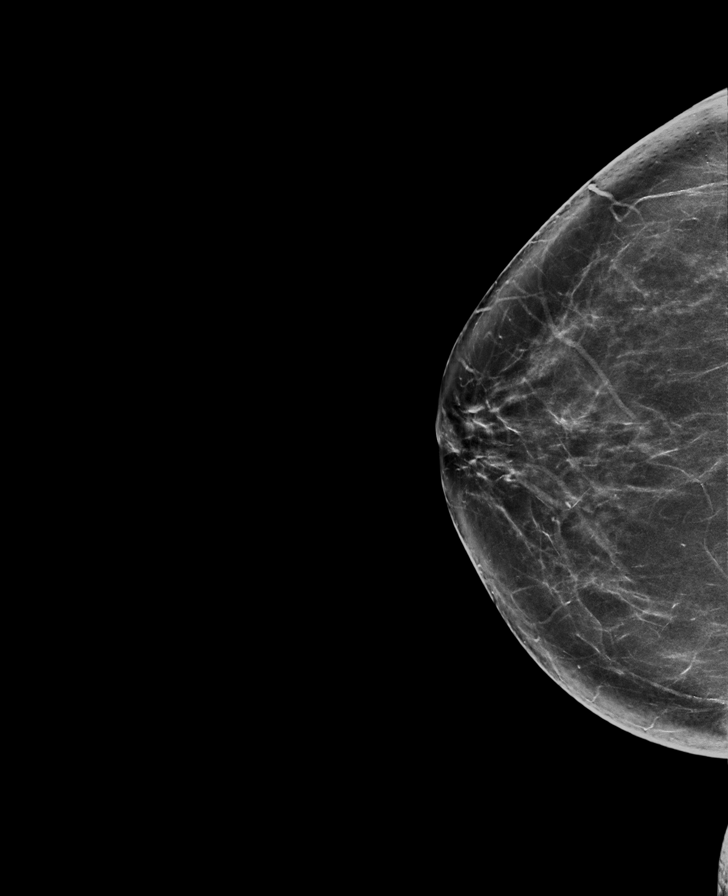

[L CC synth-2D]
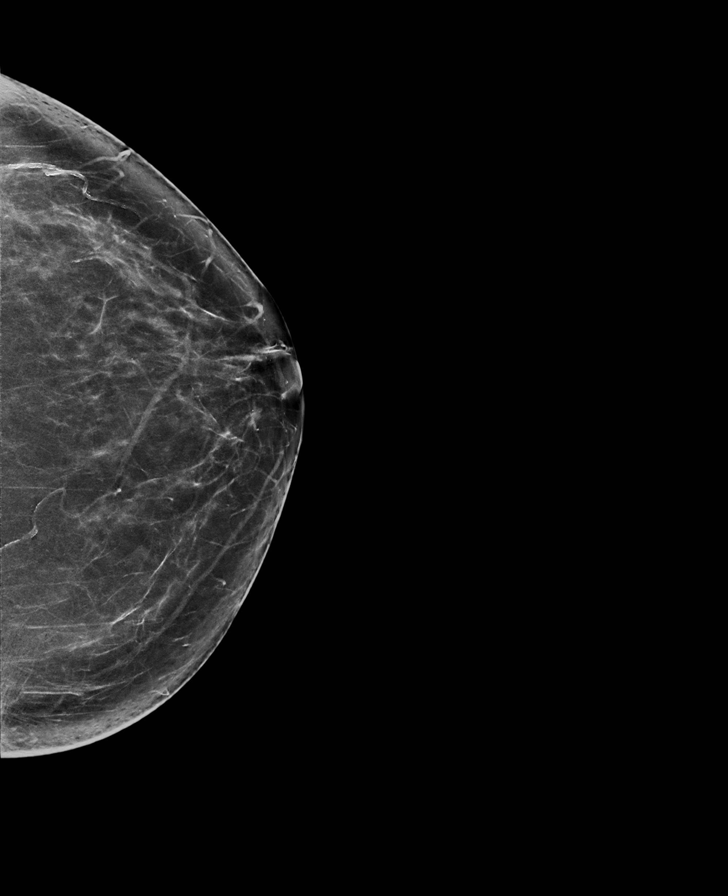

[R MLO synth-2D]
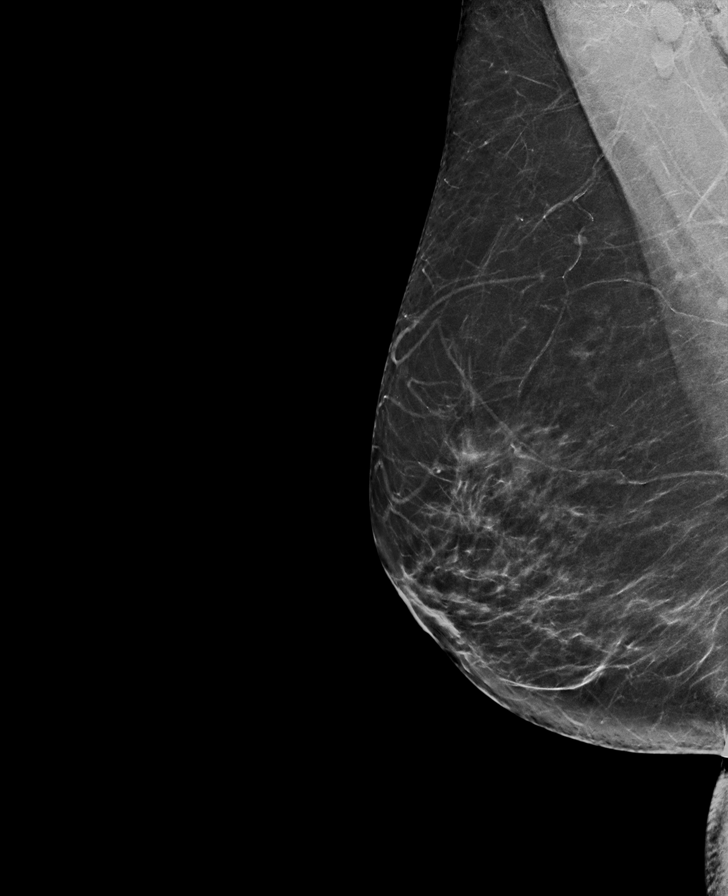

[L MLO synth-2D]
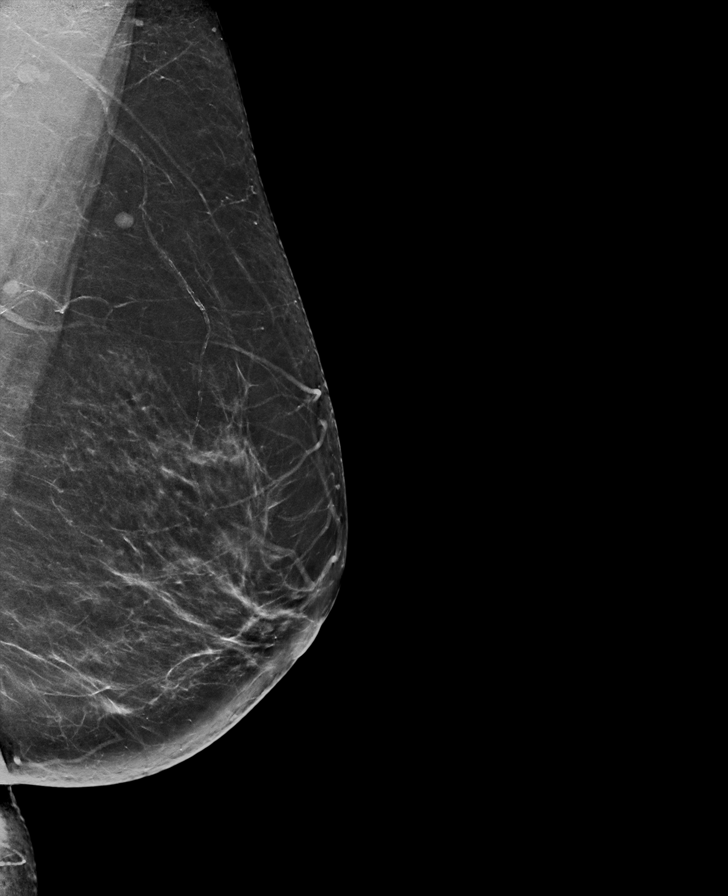

[L MLO tomo · tomo slice 39/78.0]
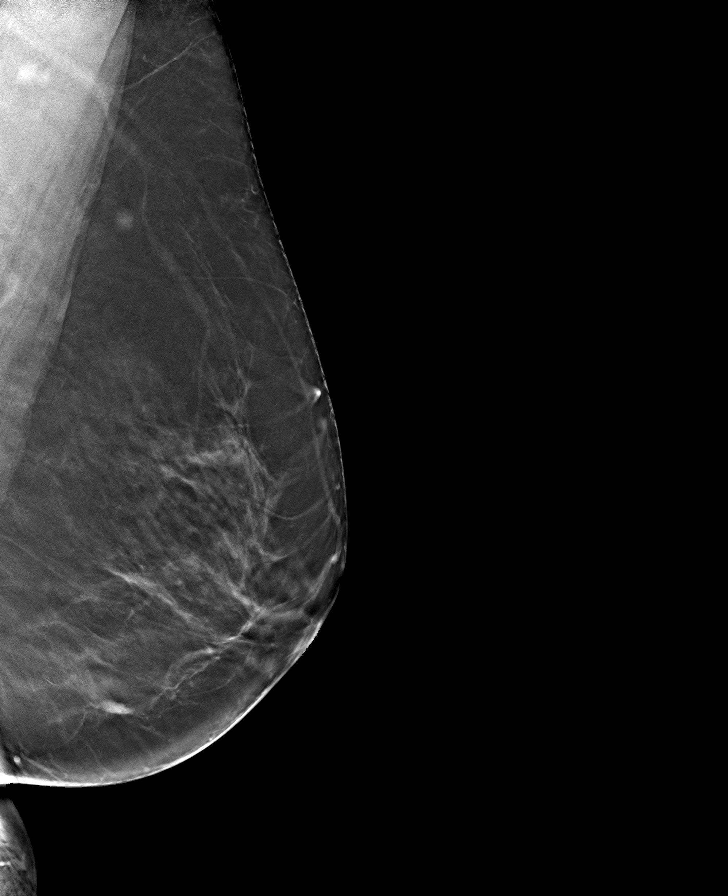

[L CC tomo · tomo slice 39/78.0]
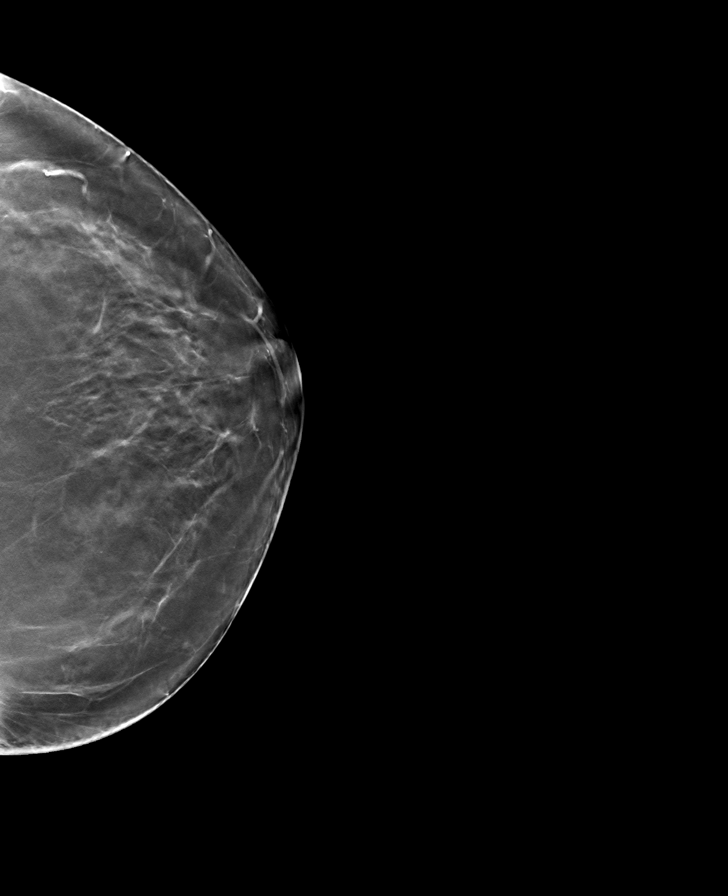

[R CC tomo · tomo slice 40/79.0]
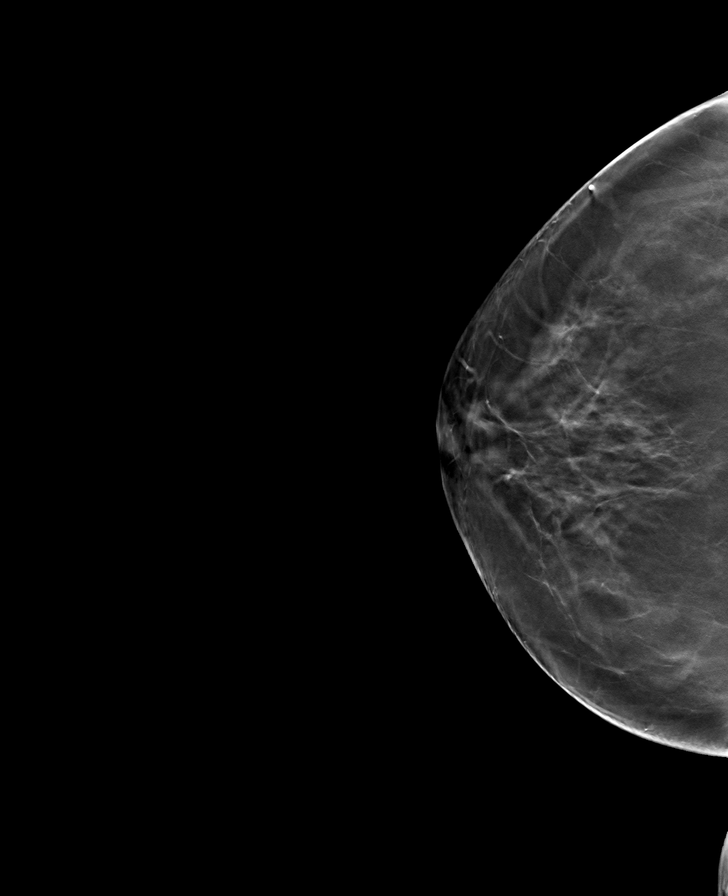

[R MLO tomo · tomo slice 39/78.0]
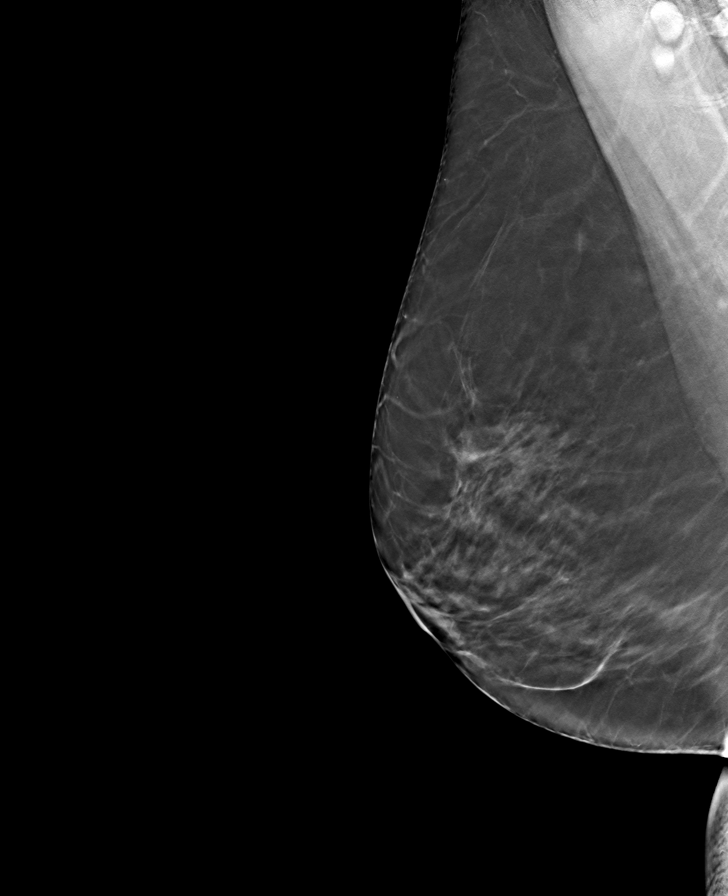

[8 of 24 positions shown; findings below may reference images not displayed]

ACR Breast Density Category b: There are scattered areas of
fibroglandular density.
FINDINGS: There are no findings suspicious for malignancy. The images were
evaluated with computer-aided detection.
IMPRESSION: No mammographic evidence of malignancy. A result letter of this
screening mammogram will be mailed directly to the patient.

RECOMMENDATION:
Screening mammogram in one year. (Code:[id number]-I-BG6)

BI-RADS CATEGORY  1: Negative.

## 2022-11-15 ENCOUNTER — Other Ambulatory Visit: Payer: Self-pay | Admitting: Advanced Practice Midwife

## 2022-11-15 DIAGNOSIS — Z1231 Encounter for screening mammogram for malignant neoplasm of breast: Secondary | ICD-10-CM

## 2022-12-27 ENCOUNTER — Ambulatory Visit
Admission: RE | Admit: 2022-12-27 | Discharge: 2022-12-27 | Disposition: A | Discharge status: Home / Self Care | Payer: BC Managed Care – PPO | Source: Ambulatory Visit | Attending: Advanced Practice Midwife | Admitting: Advanced Practice Midwife

## 2022-12-27 DIAGNOSIS — Z1231 Encounter for screening mammogram for malignant neoplasm of breast: Secondary | ICD-10-CM | POA: Insufficient documentation

## 2023-07-06 ENCOUNTER — Ambulatory Visit (INDEPENDENT_AMBULATORY_CARE_PROVIDER_SITE_OTHER): Payer: BC Managed Care – PPO | Admitting: Physician Assistant

## 2023-07-06 ENCOUNTER — Encounter: Payer: Self-pay | Admitting: Physician Assistant

## 2023-07-06 VITALS — BP 126/82 | HR 88 | Temp 98.4°F | Ht 68.5 in | Wt 182.0 lb

## 2023-07-06 DIAGNOSIS — Z114 Encounter for screening for human immunodeficiency virus [HIV]: Secondary | ICD-10-CM

## 2023-07-06 DIAGNOSIS — Z1159 Encounter for screening for other viral diseases: Secondary | ICD-10-CM

## 2023-07-06 DIAGNOSIS — Z0000 Encounter for general adult medical examination without abnormal findings: Secondary | ICD-10-CM

## 2023-07-06 DIAGNOSIS — Z13220 Encounter for screening for lipoid disorders: Secondary | ICD-10-CM

## 2023-07-06 DIAGNOSIS — R011 Cardiac murmur, unspecified: Secondary | ICD-10-CM

## 2023-07-06 NOTE — Patient Instructions (Signed)

## 2023-07-06 NOTE — Progress Notes (Signed)
Date:  07/06/2023   Name:  Virginia Pruitt   DOB:  15-May-1966   MRN:  [id number]   Chief Complaint: Establish Care  HPI Virginia Pruitt is a very pleasant 57 y.o. female with no significant past medical history here today to establish care. No complaints today.   Last Physical: 2022 with GYN.  Last Dental Exam: Oct 2024, goes q73m Last [place] Exam: Feb 2024, goes yearly  Last CRC screen: 03/14/15, one benign hyperplastic 3mm polyp Last Mammo: 12/27/22 normal Last Pap: 10/15/20 NILM, neg HPV    Medication list has been reviewed and updated.  Current Meds  Medication Sig   [DISCONTINUED] ipratropium (ATROVENT) 0.06 % nasal spray 2 sprays into each nostril four (4) times a day as needed (for cough) for up to 14 days.     Review of Systems  Constitutional:  Negative for appetite change, fatigue and unexpected weight change.  HENT:  Negative for hearing loss and mouth sores.   Eyes:  Negative for visual disturbance.  Respiratory:  Negative for cough, chest tightness, shortness of breath and wheezing.   Cardiovascular:  Negative for chest pain, palpitations and leg swelling.  Gastrointestinal:  Negative for abdominal distention, abdominal pain, blood in stool, constipation and diarrhea.  Endocrine: Negative for polydipsia, polyphagia and polyuria.  Genitourinary:  Negative for difficulty urinating, dysuria, hematuria, menstrual problem, vaginal bleeding and vaginal discharge.  Musculoskeletal:  Negative for arthralgias, back pain and gait problem.  Skin:  Negative for rash and wound.  Neurological:  Negative for dizziness, syncope, weakness, numbness and headaches.  Psychiatric/Behavioral:  Negative for behavioral problems and sleep disturbance.     Patient Active Problem List   Diagnosis Date Noted   Benign neoplasm of sigmoid colon     Allergies  Allergen Reactions   Amoxicillin Rash    Immunization History  Administered Date(s) Administered   Influenza,inj,Quad PF,6+ Mos  06/15/2021   Influenza-Unspecified 05/19/2023   PFIZER(Purple Top)SARS-COV-2 Vaccination 10/25/2019, 11/15/2019, 12/31/2020   Pfizer Covid-19 Vaccine Bivalent Booster 3yrs & up 06/16/2021   Tdap 10/15/2020    Past Surgical History:  Procedure Laterality Date   COLONOSCOPY WITH PROPOFOL N/A 03/14/2015   Procedure: COLONOSCOPY WITH PROPOFOL;  Surgeon: Midge Minium, MD;  Location: [facility];  Service: Endoscopy;  Laterality: N/A;   POLYPECTOMY  03/14/2015   Procedure: POLYPECTOMY INTESTINAL;  Surgeon: Midge Minium, MD;  Location: [facility];  Service: Endoscopy;;  sigmoid colon polyp   vaginal polyp removed      Social History   Tobacco Use   Smoking status: Never   Smokeless tobacco: Never  Vaping Use   Vaping status: Never Used  Substance Use Topics   Alcohol use: No   Drug use: Never    Family History  Problem Relation Age of Onset   Hypertension Mother    Breast cancer Sister 38       BRCA neg per pt        07/06/2023    8:39 AM 10/15/2020    3:29 PM  GAD 7 : Generalized Anxiety Score  Nervous, Anxious, on Edge 0 0  Control/stop worrying 0 0  Worry too much - different things 0 0  Trouble relaxing 0 0  Restless 0 0  Easily annoyed or irritable 0 0  Afraid - awful might happen 0 0  Total GAD 7 Score 0 0  Anxiety Difficulty Not difficult at all Not difficult at all       07/06/2023  8:39 AM 10/15/2020    3:28 PM  Depression screen PHQ 2/9  Decreased Interest 0 0  Down, Depressed, Hopeless 0 0  PHQ - 2 Score 0 0  Altered sleeping 0 0  Tired, decreased energy 0 0  Change in appetite 0 0  Feeling bad or failure about yourself  0 0  Trouble concentrating 0 0  Moving slowly or fidgety/restless 0 0  Suicidal thoughts 0 0  PHQ-9 Score 0 0  Difficult doing work/chores Not difficult at all Not difficult at all    BP Readings from Last 3 Encounters:  07/06/23 126/82  10/15/20 124/78  03/04/17 124/80    Wt Readings from Last 3 Encounters:   07/06/23 182 lb (82.6 kg)  10/15/20 181 lb (82.1 kg)  03/04/17 170 lb (77.1 kg)    BP 126/82   Pulse 88   Temp 98.4 F (36.9 C) (Oral)   Ht 5' 8.5" (1.74 m)   Wt 182 lb (82.6 kg)   SpO2 100%   BMI 27.27 kg/m   Physical Exam Vitals and nursing note reviewed. Exam conducted with a chaperone present.  Constitutional:      Appearance: Normal appearance. She is well-groomed.  HENT:     Ears:     Comments: EAC clear bilaterally with good view of TM which is without effusion or erythema.     Nose: Nose normal.     Mouth/Throat:     Mouth: Mucous membranes are moist. No oral lesions.     Dentition: Normal dentition.     Pharynx: Uvula midline. No posterior oropharyngeal erythema.  Eyes:     General: Vision grossly intact.     Extraocular Movements: Extraocular movements intact.     Conjunctiva/sclera: Conjunctivae normal.     Pupils: Pupils are equal, round, and reactive to light.  Neck:     Thyroid: No thyroid mass or thyromegaly.  Cardiovascular:     Rate and Rhythm: Normal rate and regular rhythm.     Heart sounds: S1 normal and S2 normal. Murmur heard.     Systolic murmur is present with a grade of 1/6.     No friction rub. No gallop.     Comments: Pulses 2+ at radial, PT, DP bilaterally. No carotid bruit. No peripheral edema Pulmonary:     Effort: Pulmonary effort is normal.     Breath sounds: Normal breath sounds.  Chest:     Comments: Deferred Abdominal:     General: Bowel sounds are normal.     Palpations: Abdomen is soft. There is no mass.     Tenderness: There is no abdominal tenderness.  Genitourinary:    Comments: Deferred Musculoskeletal:     Comments: Full ROM with strength 5/5 bilateral upper and lower extremities  Lymphadenopathy:     Cervical: No cervical adenopathy.  Skin:    General: Skin is warm.     Capillary Refill: Capillary refill takes less than 2 seconds.     Findings: No lesion or rash.     Comments: Scattered SK, specifically one on the  chest and one at the nape of the neck both benign appearing. No concerning spots on the back or visible skin.   Neurological:     Mental Status: She is alert and oriented to person, place, and time.     Cranial Nerves: Cranial nerves 2-12 are intact.     Gait: Gait is intact.  Psychiatric:        Mood and Affect: Mood and  affect normal.        Behavior: Behavior normal.     Recent Labs  No results found for: "NA", "K", "CL", "CO2", "GLUCOSE", "BUN", "CREATININE", "CALCIUM", "PROT", "ALBUMIN", "AST", "ALT", "ALKPHOS", "BILITOT", "GFRNONAA", "GFRAA"  No results found for: "WBC", "HGB", "HCT", "MCV", "PLT" No results found for: "HGBA1C" No results found for: "CHOL", "HDL", "LDLCALC", "LDLDIRECT", "TRIG", "CHOLHDL" No results found for: "TSH"   Assessment and Plan:  1. Annual physical exam Seemingly healthy patient with no abnormalities on exam. Encouraged continued healthy lifestyle including regular physical activity and consumption of whole fruits and vegetables. Encouraged routine dental and eye exams. Recommended Shingrix, but will defer today with Thanksgiving tomorrow. She may return any time for this or receive from pharmacy.   Check routine labs today.   - CBC with Differential/Platelet - Comprehensive metabolic panel - TSH - Lipid panel - Hepatitis C antibody - HIV Antibody (routine testing w rflx)  2. Screening for hyperlipidemia Checking fasting lipids - Lipid panel  3. Need for hepatitis C screening test - Hepatitis C antibody  4. Screening for HIV (human immunodeficiency virus) - HIV Antibody (routine testing w rflx)  5. Systolic murmur Very subtle, asymptomatic, patient reassured. Follow clinically.    Return in about 1 year (around 07/05/2024) for CPE .    Alvester Morin, PA-C, DMSc, Nutritionist [facility] [phone]

## 2023-07-07 LAB — CBC WITH DIFFERENTIAL/PLATELET
Basophils Absolute: 0.1 10*3/uL (ref 0.0–0.2)
Basos: 1 %
EOS (ABSOLUTE): 0.2 10*3/uL (ref 0.0–0.4)
Eos: 3 %
Hematocrit: 38.9 % (ref 34.0–46.6)
Hemoglobin: 12.7 g/dL (ref 11.1–15.9)
Immature Grans (Abs): 0 10*3/uL (ref 0.0–0.1)
Immature Granulocytes: 0 %
Lymphocytes Absolute: 3.7 10*3/uL — ABNORMAL HIGH (ref 0.7–3.1)
Lymphs: 39 %
MCH: 28.1 pg (ref 26.6–33.0)
MCHC: 32.6 g/dL (ref 31.5–35.7)
MCV: 86 fL (ref 79–97)
Monocytes Absolute: 0.7 10*3/uL (ref 0.1–0.9)
Monocytes: 8 %
Neutrophils Absolute: 4.9 10*3/uL (ref 1.4–7.0)
Neutrophils: 49 %
Platelets: 452 10*3/uL — ABNORMAL HIGH (ref 150–450)
RBC: 4.52 x10E6/uL (ref 3.77–5.28)
RDW: 13.1 % (ref 11.7–15.4)
WBC: 9.6 10*3/uL (ref 3.4–10.8)

## 2023-07-07 LAB — COMPREHENSIVE METABOLIC PANEL
ALT: 15 [IU]/L (ref 0–32)
AST: 20 [IU]/L (ref 0–40)
Albumin: 4.1 g/dL (ref 3.8–4.9)
Alkaline Phosphatase: 75 [IU]/L (ref 44–121)
BUN/Creatinine Ratio: 15 (ref 9–23)
BUN: 11 mg/dL (ref 6–24)
Bilirubin Total: 0.2 mg/dL (ref 0.0–1.2)
CO2: 25 mmol/L (ref 20–29)
Calcium: 9.6 mg/dL (ref 8.7–10.2)
Chloride: 103 mmol/L (ref 96–106)
Creatinine, Ser: 0.74 mg/dL (ref 0.57–1.00)
Globulin, Total: 2.8 g/dL (ref 1.5–4.5)
Glucose: 86 mg/dL (ref 70–99)
Potassium: 4.3 mmol/L (ref 3.5–5.2)
Sodium: 141 mmol/L (ref 134–144)
Total Protein: 6.9 g/dL (ref 6.0–8.5)
eGFR: 93 mL/min/{1.73_m2} (ref >59)

## 2023-07-07 LAB — LIPID PANEL
Chol/HDL Ratio: 4.7 {ratio} — ABNORMAL HIGH (ref 0.0–4.4)
Cholesterol, Total: 279 mg/dL — ABNORMAL HIGH (ref 100–199)
HDL: 59 mg/dL (ref >39)
LDL Chol Calc (NIH): 197 mg/dL — ABNORMAL HIGH (ref 0–99)
Triglycerides: 130 mg/dL (ref 0–149)
VLDL Cholesterol Cal: 23 mg/dL (ref 5–40)

## 2023-07-07 LAB — HIV ANTIBODY (ROUTINE TESTING W REFLEX): HIV Screen 4th Generation wRfx: NONREACTIVE

## 2023-07-07 LAB — HEPATITIS C ANTIBODY: Hep C Virus Ab: NONREACTIVE

## 2023-07-07 LAB — TSH: TSH: 0.926 u[IU]/mL (ref 0.450–4.500)

## 2023-07-11 ENCOUNTER — Encounter: Payer: Self-pay | Admitting: Physician Assistant

## 2023-07-11 DIAGNOSIS — E7800 Pure hypercholesterolemia, unspecified: Secondary | ICD-10-CM | POA: Insufficient documentation

## 2023-07-14 ENCOUNTER — Telehealth: Payer: Self-pay | Admitting: Physician Assistant

## 2023-07-14 NOTE — Telephone Encounter (Signed)
Please call pt to schedule an appt for 3-4 months out.  KP

## 2023-07-14 NOTE — Telephone Encounter (Signed)
Copied from CRM [id number]. Topic: General - Inquiry >> Jul 14, 2023 12:50 PM Lennox Pippins wrote: Patient has called and states she received a message from Alvester Morin PA, PCP, that she needs to schedule an appointment for 3-4 month fasting Labs and patient is not sure if Alvester Morin wants an appointment with him or just lab work, also no lab orders are in for patient to have lab work in 3-4 months. Please advise and follow back up with the patient at phone # [phone]

## 2023-08-11 ENCOUNTER — Ambulatory Visit: Payer: Self-pay | Admitting: Physician Assistant

## 2023-08-17 ENCOUNTER — Ambulatory Visit: Payer: Self-pay | Admitting: *Deleted

## 2023-08-17 NOTE — Telephone Encounter (Signed)
  Chief Complaint: speech , word  finding difficulties saying wrong word and trouble getting words out yesterday  Symptoms: mild headache , reports not eating much night before. Word finding difficulties x 1 at 7:15 am talking to brother. Last night check BP 105/60.  Frequency: yesterday morning  Pertinent Negatives: Patient denies chest pain no difficulty breathing no headache now no speech issues now no weakness either side of body . No blurred vision no other neurological sx . Disposition: [] ED /[] Urgent Care (no appt availability in office) / [x] Appointment(In office/virtual)/ []  [facility]/ [] Home Care/ [] Refused Recommended Disposition /[] [facility] [facility]/ []  Follow-up with PCP Additional Notes:   Earliest appt scheduled for tomorrow morning with PCP. Due to only 1 episode of speech issues scheduled appt . Recommended if sx change or happen again go to ED. Please advise if ok for appt tomorrow or if patient can be seen today . Please advise. Patient at work currently.  Patient reported sx occurred years ago  but no other sx as well.      Reason for Disposition  [1] Loss of speech or garbled speech AND [2] gradual onset (e.g., days to weeks) AND [3] present now  Answer Assessment - Initial Assessment Questions 1. SYMPTOM: What is the main symptom you are concerned about? (e.g., weakness, numbness)     Denies slurred speech but problems getting words out and saying the wrong words 2. ONSET: When did this start? (minutes, hours, days; while sleeping)     Yesterday  3. LAST NORMAL: When was the last time you (the patient) were normal (no symptoms)?     Yesterday  4. PATTERN Does this come and go, or has it been constant since it started?  Is it present now?     Only 1 incident yesterday  5. CARDIAC SYMPTOMS: Have you had any of the following symptoms: chest pain, difficulty breathing, palpitations?     no 6. NEUROLOGIC SYMPTOMS: Have you had any of the  following symptoms: headache, dizziness, vision loss, double vision, changes in speech, unsteady on your feet?     Speech was affected yesterday and trouble getting words out and saying wrong words approx 7:15am.  7. OTHER SYMPTOMS: Do you have any other symptoms?     Mild headache 8. PREGNANCY: Is there any chance you are pregnant? When was your last menstrual period?     na  Protocols used: Neurologic Deficit-A-AH

## 2023-08-17 NOTE — Telephone Encounter (Signed)
 Spoke with patient directly via phone. Symptoms have resolved and she is back to baseline, no further neurological issues.  Keep follow-up appoint with me for tomorrow morning.  Plan to repeat fasting lipids since LDL was 197 at last check November 2024. Consider statin pending repeat labs.  The 10-year ASCVD risk score (Arnett DK, et al., 2019) is: 5.8%   Values used to calculate the score:     Age: 58 years     Sex: Female     Is Non-Hispanic African American: Yes     Diabetic: No     Tobacco smoker: No     Systolic Blood Pressure: 126 mmHg     Is BP treated: No     HDL Cholesterol: 59 mg/dL     Total Cholesterol: 279 mg/dL

## 2023-08-17 NOTE — Telephone Encounter (Signed)
 Please review.  KP

## 2023-08-18 ENCOUNTER — Encounter: Payer: Self-pay | Admitting: Physician Assistant

## 2023-08-18 ENCOUNTER — Ambulatory Visit (INDEPENDENT_AMBULATORY_CARE_PROVIDER_SITE_OTHER): Payer: 59 | Admitting: Physician Assistant

## 2023-08-18 VITALS — BP 100/78 | HR 89 | Temp 98.3°F | Ht 68.5 in | Wt 181.0 lb

## 2023-08-18 DIAGNOSIS — E7800 Pure hypercholesterolemia, unspecified: Secondary | ICD-10-CM

## 2023-08-18 DIAGNOSIS — R4789 Other speech disturbances: Secondary | ICD-10-CM | POA: Diagnosis not present

## 2023-08-18 NOTE — Patient Instructions (Signed)
-  It was a pleasure to see you today! Please review your visit summary for helpful information -Lab results are usually available within 1-2 days and we will call once reviewed -I would encourage you to follow your care via MyChart where you can access lab results, notes, messages, and more -If you feel that we did a nice job today, please complete your after-visit survey and leave [place] a Google review! Your CMA today was [place] and your provider was Alvester Morin, PA-C, DMSc
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
## 2023-08-19 LAB — LIPID PANEL
Chol/HDL Ratio: 4 {ratio} (ref 0.0–4.4)
Cholesterol, Total: 247 mg/dL — ABNORMAL HIGH (ref 100–199)
HDL: 61 mg/dL (ref >39)
LDL Chol Calc (NIH): 166 mg/dL — ABNORMAL HIGH (ref 0–99)
Triglycerides: 115 mg/dL (ref 0–149)
VLDL Cholesterol Cal: 20 mg/dL (ref 5–40)

## 2023-08-24 ENCOUNTER — Ambulatory Visit: Payer: Self-pay | Admitting: Physician Assistant

## 2023-10-14 ENCOUNTER — Ambulatory Visit: Payer: BC Managed Care – PPO | Admitting: Physician Assistant

## 2023-11-14 ENCOUNTER — Other Ambulatory Visit: Payer: Self-pay | Admitting: Physician Assistant

## 2023-11-14 DIAGNOSIS — Z1231 Encounter for screening mammogram for malignant neoplasm of breast: Secondary | ICD-10-CM

## 2023-12-28 ENCOUNTER — Ambulatory Visit
Admission: RE | Admit: 2023-12-28 | Discharge: 2023-12-28 | Disposition: A | Discharge status: Home / Self Care | Payer: Self-pay | Source: Ambulatory Visit | Attending: Physician Assistant | Admitting: Physician Assistant

## 2023-12-28 DIAGNOSIS — Z1231 Encounter for screening mammogram for malignant neoplasm of breast: Secondary | ICD-10-CM | POA: Diagnosis present

## 2024-01-16 ENCOUNTER — Ambulatory Visit: Payer: Self-pay | Admitting: Physician Assistant

## 2024-01-16 ENCOUNTER — Encounter: Payer: Self-pay | Admitting: Physician Assistant

## 2024-01-16 VITALS — BP 124/84 | HR 85 | Ht 68.5 in | Wt 186.0 lb

## 2024-01-16 DIAGNOSIS — E7800 Pure hypercholesterolemia, unspecified: Secondary | ICD-10-CM | POA: Diagnosis not present

## 2024-01-16 DIAGNOSIS — D72820 Lymphocytosis (symptomatic): Secondary | ICD-10-CM

## 2024-01-16 NOTE — Progress Notes (Signed)
 Date:  01/16/2024   Name:  Virginia Pruitt   DOB:  1965-08-15   MRN:  [id number]   Chief Complaint: Hyperlipidemia  HPI Virginia Pruitt returns for 62-month fasting follow-up on very high LDL of 197 in November 2024 and 166 on recheck January 2025 not presently on statin due to low overall ASCVD risk of 2.6%. She is working on lifestyle changes to include reduction of saturated fats and routine physical activity. Weight fluctuates but overall fairly stable.    Medication list has been reviewed and updated.  No outpatient medications have been marked as taking for the 01/16/24 encounter (Office Visit) with Leopoldo Rancher, PA.     Review of Systems  Patient Active Problem List   Diagnosis Date Noted   Pure hypercholesterolemia 07/11/2023   Benign neoplasm of sigmoid colon     Allergies  Allergen Reactions   Amoxicillin Rash    Immunization History  Administered Date(s) Administered   Influenza,inj,Quad PF,6+ Mos 06/15/2021   Influenza-Unspecified 05/19/2023   PFIZER(Purple Top)SARS-COV-2 Vaccination 10/25/2019, 11/15/2019, 12/31/2020   Pfizer Covid-19 Vaccine Bivalent Booster 22yrs & up 06/16/2021   Tdap 10/15/2020    Past Surgical History:  Procedure Laterality Date   COLONOSCOPY WITH PROPOFOL  N/A 03/14/2015   Procedure: COLONOSCOPY WITH PROPOFOL ;  Surgeon: Marnee Sink, MD;  Location:  [facility];  Service: Endoscopy;  Laterality: N/A;   POLYPECTOMY  03/14/2015   Procedure: POLYPECTOMY INTESTINAL;  Surgeon: Marnee Sink, MD;  Location: [facility];  Service: Endoscopy;;  sigmoid colon polyp   vaginal polyp removed      Social History   Tobacco Use   Smoking status: Never   Smokeless tobacco: Never  Vaping Use   Vaping status: Never Used  Substance Use Topics   Alcohol use: No   Drug use: Never    Family History  Problem Relation Age of Onset   Hypertension Mother    Breast cancer Sister 29       BRCA neg per pt        01/16/2024    8:33 AM  08/18/2023    8:19 AM 07/06/2023    8:39 AM 10/15/2020    3:29 PM  GAD 7 : Generalized Anxiety Score  Nervous, Anxious, on Edge 0 0 0 0  Control/stop worrying 0 0 0 0  Worry too much - different things 0 0 0 0  Trouble relaxing 0 0 0 0  Restless 0 0 0 0  Easily annoyed or irritable 0 0 0 0  Afraid - awful might happen 0 0 0 0  Total GAD 7 Score 0 0 0 0  Anxiety Difficulty Not difficult at all Not difficult at all Not difficult at all Not difficult at all       01/16/2024    8:33 AM 08/18/2023    8:19 AM 07/06/2023    8:39 AM  Depression screen PHQ 2/9  Decreased Interest 0 0 0  Down, Depressed, Hopeless 0 0 0  PHQ - 2 Score 0 0 0  Altered sleeping 0  0  Tired, decreased energy 0  0  Change in appetite 0  0  Feeling bad or failure about yourself  0  0  Trouble concentrating 0  0  Moving slowly or fidgety/restless 0  0  Suicidal thoughts 0  0  PHQ-9 Score 0  0  Difficult doing work/chores Not difficult at all  Not difficult at all    BP Readings from Last 3 Encounters:  01/16/24 124/84  08/18/23 100/78  07/06/23 126/82    Wt Readings from Last 3 Encounters:  01/16/24 186 lb (84.4 kg)  08/18/23 181 lb (82.1 kg)  07/06/23 182 lb (82.6 kg)    BP 124/84   Pulse 85   Ht 5' 8.5" (1.74 m)   Wt 186 lb (84.4 kg)   SpO2 96%   BMI 27.87 kg/m   Physical Exam Vitals and nursing note reviewed.  Constitutional:      Appearance: Normal appearance.  Cardiovascular:     Rate and Rhythm: Normal rate and regular rhythm.     Heart sounds: No murmur heard.    No friction rub. No gallop.  Pulmonary:     Effort: Pulmonary effort is normal.     Breath sounds: Normal breath sounds.  Abdominal:     General: There is no distension.  Musculoskeletal:        General: Normal range of motion.  Skin:    General: Skin is warm and dry.  Neurological:     Mental Status: She is alert and oriented to person, place, and time.     Gait: Gait is intact.  Psychiatric:        Mood and Affect:  Mood and affect normal.     Recent Labs     Component Value Date/Time   NA 141 07/06/2023 0938   K 4.3 07/06/2023 0938   CL 103 07/06/2023 0938   CO2 25 07/06/2023 0938   GLUCOSE 86 07/06/2023 0938   BUN 11 07/06/2023 0938   CREATININE 0.74 07/06/2023 0938   CALCIUM 9.6 07/06/2023 0938   PROT 6.9 07/06/2023 0938   ALBUMIN 4.1 07/06/2023 0938   AST 20 07/06/2023 0938   ALT 15 07/06/2023 0938   ALKPHOS 75 07/06/2023 0938   BILITOT 0.2 07/06/2023 0938    Lab Results  Component Value Date   WBC 9.6 07/06/2023   HGB 12.7 07/06/2023   HCT 38.9 07/06/2023   MCV 86 07/06/2023   PLT 452 (H) 07/06/2023   No results found for: "HGBA1C" Lab Results  Component Value Date   CHOL 247 (H) 08/18/2023   HDL 61 08/18/2023   LDLCALC 166 (H) 08/18/2023   TRIG 115 08/18/2023   CHOLHDL 4.0 08/18/2023   Lab Results  Component Value Date   TSH 0.926 07/06/2023     Assessment and Plan:  Pure hypercholesterolemia Assessment & Plan: Check fasting lipids today  Orders: -     Lipid panel  Lymphocytosis -     CBC with Differential/Platelet  Mild lymphocytosis last time, will repeat CBC with her labs today to confirm resolution   Return in about 6 months (around 07/17/2024) for fasting CPE.    Cody Das, PA-C, DMSc, Nutritionist [facility] Primary Care and Sports Medicine [facility] [phone]

## 2024-01-16 NOTE — Assessment & Plan Note (Signed)
Check fasting lipids today 

## 2024-01-17 ENCOUNTER — Other Ambulatory Visit: Payer: Self-pay | Admitting: Physician Assistant

## 2024-01-17 ENCOUNTER — Ambulatory Visit: Payer: Self-pay | Admitting: Physician Assistant

## 2024-01-17 DIAGNOSIS — E7800 Pure hypercholesterolemia, unspecified: Secondary | ICD-10-CM

## 2024-01-17 LAB — CBC WITH DIFFERENTIAL/PLATELET
Basophils Absolute: 0.1 10*3/uL (ref 0.0–0.2)
Basos: 1 %
EOS (ABSOLUTE): 0.3 10*3/uL (ref 0.0–0.4)
Eos: 3 %
Hematocrit: 39.8 % (ref 34.0–46.6)
Hemoglobin: 12.9 g/dL (ref 11.1–15.9)
Immature Grans (Abs): 0 10*3/uL (ref 0.0–0.1)
Immature Granulocytes: 0 %
Lymphocytes Absolute: 3.9 10*3/uL — ABNORMAL HIGH (ref 0.7–3.1)
Lymphs: 40 %
MCH: 28 pg (ref 26.6–33.0)
MCHC: 32.4 g/dL (ref 31.5–35.7)
MCV: 87 fL (ref 79–97)
Monocytes Absolute: 0.8 10*3/uL (ref 0.1–0.9)
Monocytes: 8 %
Neutrophils Absolute: 4.7 10*3/uL (ref 1.4–7.0)
Neutrophils: 48 %
Platelets: 434 10*3/uL (ref 150–450)
RBC: 4.6 x10E6/uL (ref 3.77–5.28)
RDW: 12.4 % (ref 11.7–15.4)
WBC: 9.8 10*3/uL (ref 3.4–10.8)

## 2024-01-17 LAB — LIPID PANEL
Chol/HDL Ratio: 4.4 ratio (ref 0.0–4.4)
Cholesterol, Total: 283 mg/dL — ABNORMAL HIGH (ref 100–199)
HDL: 65 mg/dL (ref >39)
LDL Chol Calc (NIH): 201 mg/dL — ABNORMAL HIGH (ref 0–99)
Triglycerides: 101 mg/dL (ref 0–149)
VLDL Cholesterol Cal: 17 mg/dL (ref 5–40)

## 2024-01-17 MED ORDER — ROSUVASTATIN CALCIUM 5 MG PO TABS: 5.0000 mg | ORAL_TABLET | Freq: Every day | ORAL | 1 refills | Status: DC

## 2024-02-15 ENCOUNTER — Other Ambulatory Visit: Payer: Self-pay | Admitting: Physician Assistant

## 2024-02-15 DIAGNOSIS — D72820 Lymphocytosis (symptomatic): Secondary | ICD-10-CM

## 2024-02-21 ENCOUNTER — Other Ambulatory Visit: Payer: Self-pay | Admitting: Physician Assistant

## 2024-02-21 ENCOUNTER — Ambulatory Visit: Payer: Self-pay | Admitting: Physician Assistant

## 2024-02-21 DIAGNOSIS — D72820 Lymphocytosis (symptomatic): Secondary | ICD-10-CM

## 2024-02-21 LAB — CBC WITH DIFFERENTIAL/PLATELET
Basophils Absolute: 0.1 x10E3/uL (ref 0.0–0.2)
Basos: 1 %
EOS (ABSOLUTE): 0.3 x10E3/uL (ref 0.0–0.4)
Eos: 2 %
Hematocrit: 37.5 % (ref 34.0–46.6)
Hemoglobin: 11.9 g/dL (ref 11.1–15.9)
Immature Grans (Abs): 0 x10E3/uL (ref 0.0–0.1)
Immature Granulocytes: 0 %
Lymphocytes Absolute: 4.8 x10E3/uL — ABNORMAL HIGH (ref 0.7–3.1)
Lymphs: 44 %
MCH: 27.5 pg (ref 26.6–33.0)
MCHC: 31.7 g/dL (ref 31.5–35.7)
MCV: 87 fL (ref 79–97)
Monocytes Absolute: 1.1 x10E3/uL — ABNORMAL HIGH (ref 0.1–0.9)
Monocytes: 10 %
Neutrophils Absolute: 4.7 x10E3/uL (ref 1.4–7.0)
Neutrophils: 43 %
Platelets: 408 x10E3/uL (ref 150–450)
RBC: 4.33 x10E6/uL (ref 3.77–5.28)
RDW: 12.5 % (ref 11.7–15.4)
WBC: 10.9 x10E3/uL — ABNORMAL HIGH (ref 3.4–10.8)

## 2024-03-07 ENCOUNTER — Inpatient Hospital Stay

## 2024-03-07 ENCOUNTER — Encounter: Payer: Self-pay | Admitting: Oncology

## 2024-03-07 ENCOUNTER — Inpatient Hospital Stay: Attending: Oncology | Admitting: Oncology

## 2024-03-07 VITALS — BP 143/86 | HR 76 | Temp 96.0°F | Resp 18 | Wt 188.0 lb

## 2024-03-07 DIAGNOSIS — R011 Cardiac murmur, unspecified: Secondary | ICD-10-CM | POA: Insufficient documentation

## 2024-03-07 DIAGNOSIS — Z803 Family history of malignant neoplasm of breast: Secondary | ICD-10-CM | POA: Insufficient documentation

## 2024-03-07 DIAGNOSIS — D72820 Lymphocytosis (symptomatic): Secondary | ICD-10-CM | POA: Diagnosis present

## 2024-03-07 DIAGNOSIS — Z800 Family history of malignant neoplasm of digestive organs: Secondary | ICD-10-CM | POA: Insufficient documentation

## 2024-03-07 DIAGNOSIS — Z809 Family history of malignant neoplasm, unspecified: Secondary | ICD-10-CM | POA: Insufficient documentation

## 2024-03-07 LAB — CBC WITH DIFFERENTIAL/PLATELET
Abs Immature Granulocytes: 0.02 K/uL (ref 0.00–0.07)
Basophils Absolute: 0.1 K/uL (ref 0.0–0.1)
Basophils Relative: 1 %
Eosinophils Absolute: 0.2 K/uL (ref 0.0–0.5)
Eosinophils Relative: 3 %
HCT: 39.9 % (ref 36.0–46.0)
Hemoglobin: 12.9 g/dL (ref 12.0–15.0)
Immature Granulocytes: 0 %
Lymphocytes Relative: 36 %
Lymphs Abs: 3.1 K/uL (ref 0.7–4.0)
MCH: 27.9 pg (ref 26.0–34.0)
MCHC: 32.3 g/dL (ref 30.0–36.0)
MCV: 86.4 fL (ref 80.0–100.0)
Monocytes Absolute: 0.7 K/uL (ref 0.1–1.0)
Monocytes Relative: 8 %
Neutro Abs: 4.7 K/uL (ref 1.7–7.7)
Neutrophils Relative %: 52 %
Platelets: 295 K/uL (ref 150–400)
RBC: 4.62 MIL/uL (ref 3.87–5.11)
RDW: 12.6 % (ref 11.5–15.5)
WBC: 8.9 K/uL (ref 4.0–10.5)
nRBC: 0 % (ref 0.0–0.2)

## 2024-03-07 LAB — CMP (CANCER CENTER ONLY)
ALT: 17 U/L (ref 0–44)
AST: 21 U/L (ref 15–41)
Albumin: 4.2 g/dL (ref 3.5–5.0)
Alkaline Phosphatase: 59 U/L (ref 38–126)
Anion gap: 9 (ref 5–15)
BUN: 12 mg/dL (ref 6–20)
CO2: 23 mmol/L (ref 22–32)
Calcium: 9.3 mg/dL (ref 8.9–10.3)
Chloride: 105 mmol/L (ref 98–111)
Creatinine: 0.68 mg/dL (ref 0.44–1.00)
GFR, Estimated: >60 mL/min (ref >60)
Glucose, Bld: 97 mg/dL (ref 70–99)
Potassium: 3.7 mmol/L (ref 3.5–5.1)
Sodium: 137 mmol/L (ref 135–145)
Total Bilirubin: 0.8 mg/dL (ref 0.0–1.2)
Total Protein: 7.9 g/dL (ref 6.5–8.1)

## 2024-03-07 LAB — HEPATITIS PANEL, ACUTE
HCV Ab: NONREACTIVE
Hep A IgM: NONREACTIVE
Hep B C IgM: NONREACTIVE
Hepatitis B Surface Ag: NONREACTIVE

## 2024-03-07 LAB — HIV ANTIBODY (ROUTINE TESTING W REFLEX): HIV Screen 4th Generation wRfx: NONREACTIVE

## 2024-03-07 LAB — LACTATE DEHYDROGENASE: LDH: 167 U/L (ref 98–192)

## 2024-03-07 NOTE — Assessment & Plan Note (Signed)
 Recommend genetic counseling. Patient declines.

## 2024-03-07 NOTE — Progress Notes (Signed)
 Hematology/Oncology Consult note [phone] [phone]        REFERRING PROVIDER: Manya Toribio SQUIBB, PA   CHIEF COMPLAINTS/REASON FOR VISIT:  Evaluation of lymphocytosis   ASSESSMENT & PLAN:   Family history of cancer Recommend genetic counseling. Patient declines.    Cardiac murmur Follow up with PCP  Lymphocytosis Chronic lymphocytosis for at least 6 months, differential diagnosis is broad, acute or chronic infection and inflammation, autoimmune disease, or underlying bone marrow disorders.   She is asymptomatic.  I recommend checking CBC;CMP, LDH, smear review, peripheral flowcytometry, hepatitis, HIV, monoclonal gammopathy workup.    Orders Placed This Encounter  Procedures   CBC with Differential/Platelet    Standing Status:   Future    Expected Date:   03/07/2024    Expiration Date:   06/05/2024   Multiple Myeloma Panel (SPEP&IFE w/QIG)    Standing Status:   Future    Expected Date:   03/07/2024    Expiration Date:   06/05/2024   Kappa/lambda light chains    Standing Status:   Future    Expected Date:   03/07/2024    Expiration Date:   06/05/2024   Flow cytometry panel-leukemia/lymphoma work-up    Standing Status:   Future    Expected Date:   03/07/2024    Expiration Date:   06/05/2024   Lactate dehydrogenase    Standing Status:   Future    Expected Date:   03/07/2024    Expiration Date:   06/05/2024   HIV Antibody (routine testing w rflx)    Standing Status:   Future    Expected Date:   03/07/2024    Expiration Date:   06/05/2024   Hepatitis panel, acute    Standing Status:   Future    Expected Date:   03/07/2024    Expiration Date:   06/05/2024   CMP (Cancer Center only)    Standing Status:   Future    Expected Date:   03/07/2024    Expiration Date:   06/05/2024    All questions were answered. The patient knows to call the clinic with any problems, questions or concerns.  Zelphia Cap, MD, PhD [facility] Hematology  Oncology 03/07/2024   HISTORY OF PRESENTING ILLNESS:   KIERSTYNN BABICH is a  58 y.o.  female with PMH listed below was seen in consultation at the request of  Manya Toribio SQUIBB, [place]  for evaluation of leukocytosis.  Patient was noticed to have lymphocytosis since 2024.Denies weight loss, fever, chills, fatigue, night sweats.  Denies smoking or alcohol use. She feels pretty well at her baseline health state.  Denies any chronic wound, dental cavities.  Denies any prosthetic joints, implants or IUD. Denies any steroid use.    MEDICAL HISTORY:  Past Medical History:  Diagnosis Date   Family history of breast cancer     SURGICAL HISTORY: Past Surgical History:  Procedure Laterality Date   COLONOSCOPY WITH PROPOFOL  N/A 03/14/2015   Procedure: COLONOSCOPY WITH PROPOFOL ;  Surgeon: Rogelia Copping, MD;  Location: [facility];  Service: Endoscopy;  Laterality: N/A;   POLYPECTOMY  03/14/2015   Procedure: POLYPECTOMY INTESTINAL;  Surgeon: Rogelia Copping, MD;  Location: [facility];  Service: Endoscopy;;  sigmoid colon polyp   vaginal polyp removed      SOCIAL HISTORY: Social History   Socioeconomic History   Marital status: Married    Spouse name: Not on file   Number of children: 3   Years of education: Not  on file   Highest education level: Associate degree: occupational, Scientist, product/process development, or vocational program  Occupational History   Not on file  Tobacco Use   Smoking status: Never   Smokeless tobacco: Never  Vaping Use   Vaping status: Never Used  Substance and Sexual Activity   Alcohol use: No   Drug use: Never   Sexual activity: Not Currently    Birth control/protection: Post-menopausal  Other Topics Concern   Not on file  Social History Narrative   Not on file   Social Drivers of Health   Financial Resource Strain: Low Risk  (01/12/2024)   Overall Financial Resource Strain (CARDIA)    Difficulty of Paying Living Expenses: Not hard at all  Food Insecurity: No Food  Insecurity (01/12/2024)   Hunger Vital Sign    Worried About Running Out of Food in the Last Year: Never true    Ran Out of Food in the Last Year: Never true  Transportation Needs: No Transportation Needs (01/12/2024)   PRAPARE - Administrator, Civil Service (Medical): No    Lack of Transportation (Non-Medical): No  Physical Activity: Sufficiently Active (01/12/2024)   Exercise Vital Sign    Days of Exercise per Week: 4 days    Minutes of Exercise per Session: 90 min  Stress: No Stress Concern Present (01/12/2024)   [employer] of Occupational Health - Occupational Stress Questionnaire    Feeling of Stress : Not at all  Social Connections: Socially Integrated (01/12/2024)   Social Connection and Isolation Panel    Frequency of Communication with Friends and Family: More than three times a week    Frequency of Social Gatherings with Friends and Family: Twice a week    Attends Religious Services: More than 4 times per year    Active Member of [employer] or Organizations: Yes    Attends Engineer, structural: More than 4 times per year    Marital Status: Married  Catering manager Violence: Not At Risk (08/18/2023)   Humiliation, Afraid, Rape, and Kick questionnaire    Fear of Current or Ex-Partner: No    Emotionally Abused: No    Physically Abused: No    Sexually Abused: No    FAMILY HISTORY: Family History  Problem Relation Age of Onset   Hypertension Mother    Breast cancer Sister 46       BRCA neg per pt   Colon cancer Sister     ALLERGIES:  is allergic to amoxicillin.  MEDICATIONS:  Current Outpatient Medications  Medication Sig Dispense Refill   rosuvastatin  (CRESTOR ) 5 MG tablet Take 1 tablet (5 mg total) by mouth daily. 30 tablet 1   No current facility-administered medications for this visit.    Review of Systems  Constitutional:  Negative for appetite change, chills, fatigue and fever.  HENT:   Negative for hearing loss and voice change.   Eyes:   Negative for eye problems.  Respiratory:  Negative for chest tightness and cough.   Cardiovascular:  Negative for chest pain.  Gastrointestinal:  Negative for abdominal distention, abdominal pain and blood in stool.  Endocrine: Negative for hot flashes.  Genitourinary:  Negative for difficulty urinating and frequency.   Musculoskeletal:  Negative for arthralgias.  Skin:  Negative for itching and rash.  Neurological:  Negative for extremity weakness.  Hematological:  Negative for adenopathy.  Psychiatric/Behavioral:  Negative for confusion.    PHYSICAL EXAMINATION:  Vitals:   03/07/24 0929  BP: (!) 143/86  Pulse:  76  Resp: 18  Temp: (!) 96 F (35.6 C)  SpO2: 98%   Filed Weights   03/07/24 0929  Weight: 188 lb (85.3 kg)    Physical Exam Constitutional:      General: She is not in acute distress. HENT:     Head: Normocephalic and atraumatic.  Eyes:     General: No scleral icterus. Cardiovascular:     Rate and Rhythm: Normal rate and regular rhythm.     Heart sounds: Normal heart sounds.  Pulmonary:     Effort: Pulmonary effort is normal. No respiratory distress.     Breath sounds: No wheezing.  Abdominal:     General: Bowel sounds are normal. There is no distension.     Palpations: Abdomen is soft.  Musculoskeletal:        General: No deformity. Normal range of motion.     Cervical back: Normal range of motion and neck supple.  Skin:    General: Skin is warm and dry.     Findings: No erythema or rash.  Neurological:     Mental Status: She is alert and oriented to person, place, and time. Mental status is at baseline.     Cranial Nerves: No cranial nerve deficit.     Coordination: Coordination normal.  Psychiatric:        Mood and Affect: Mood normal.     LABORATORY DATA:  I have reviewed the data as listed    Latest Ref Rng & Units 02/20/2024    1:19 PM 01/16/2024    9:07 AM 07/06/2023    9:38 AM  CBC  WBC 3.4 - 10.8 x10E3/uL 10.9  9.8  9.6   Hemoglobin  11.1 - 15.9 g/dL 88.0  87.0  87.2   Hematocrit 34.0 - 46.6 % 37.5  39.8  38.9   Platelets 150 - 450 x10E3/uL 408  434  452       Latest Ref Rng & Units 07/06/2023    9:38 AM  CMP  Glucose 70 - 99 mg/dL 86   BUN 6 - 24 mg/dL 11   Creatinine 9.42 - 1.00 mg/dL 9.25   Sodium 865 - 855 mmol/L 141   Potassium 3.5 - 5.2 mmol/L 4.3   Chloride 96 - 106 mmol/L 103   CO2 20 - 29 mmol/L 25   Calcium  8.7 - 10.2 mg/dL 9.6   Total Protein 6.0 - 8.5 g/dL 6.9   Total Bilirubin 0.0 - 1.2 mg/dL 0.2   Alkaline Phos 44 - 121 IU/L 75   AST 0 - 40 IU/L 20   ALT 0 - 32 IU/L 15       RADIOGRAPHIC STUDIES: I have personally reviewed the radiological images as listed and agreed with the findings in the report. MM 3D SCREENING MAMMOGRAM BILATERAL BREAST Result Date: 01/04/2024 CLINICAL DATA:  Screening. EXAM: DIGITAL SCREENING BILATERAL MAMMOGRAM WITH TOMOSYNTHESIS AND CAD TECHNIQUE: Bilateral screening digital craniocaudal and mediolateral oblique mammograms were obtained. Bilateral screening digital breast tomosynthesis was performed. The images were evaluated with computer-aided detection. COMPARISON:  Previous exam(s). ACR Breast Density Category b: There are scattered areas of fibroglandular density. FINDINGS: There are no findings suspicious for malignancy. IMPRESSION: No mammographic evidence of malignancy. A result letter of this screening mammogram will be mailed directly to the patient. RECOMMENDATION: Screening mammogram in one year. (Code:SM-B-01Y) BI-RADS CATEGORY  1: Negative. Electronically Signed   By: Toribio Agreste M.D.   On: 01/04/2024 08:37

## 2024-03-07 NOTE — Assessment & Plan Note (Signed)
 Follow up with PCP

## 2024-03-07 NOTE — Assessment & Plan Note (Signed)
 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   She is asymptomatic.  I recommend checking CBC;CMP, LDH, smear review, peripheral flowcytometry, hepatitis, HIV, monoclonal gammopathy workup.

## 2024-03-08 LAB — KAPPA/LAMBDA LIGHT CHAINS
Kappa free light chain: 15 mg/L (ref 3.3–19.4)
Kappa, lambda light chain ratio: 0.93 (ref 0.26–1.65)
Lambda free light chains: 16.1 mg/L (ref 5.7–26.3)

## 2024-03-09 LAB — MULTIPLE MYELOMA PANEL, SERUM
Albumin SerPl Elph-Mcnc: 3.8 g/dL (ref 2.9–4.4)
Albumin/Glob SerPl: 1.1 (ref 0.7–1.7)
Alpha 1: 0.2 g/dL (ref 0.0–0.4)
Alpha2 Glob SerPl Elph-Mcnc: 0.9 g/dL (ref 0.4–1.0)
B-Globulin SerPl Elph-Mcnc: 1.4 g/dL — ABNORMAL HIGH (ref 0.7–1.3)
Gamma Glob SerPl Elph-Mcnc: 1 g/dL (ref 0.4–1.8)
Globulin, Total: 3.5 g/dL (ref 2.2–3.9)
IgA: 306 mg/dL (ref 87–352)
IgG (Immunoglobin G), Serum: 1136 mg/dL (ref 586–1602)
IgM (Immunoglobulin M), Srm: 20 mg/dL — ABNORMAL LOW (ref 26–217)
Total Protein ELP: 7.3 g/dL (ref 6.0–8.5)

## 2024-03-12 LAB — COMP PANEL: LEUKEMIA/LYMPHOMA

## 2024-03-18 ENCOUNTER — Other Ambulatory Visit: Payer: Self-pay | Admitting: Physician Assistant

## 2024-03-18 DIAGNOSIS — E7800 Pure hypercholesterolemia, unspecified: Secondary | ICD-10-CM

## 2024-03-21 NOTE — Telephone Encounter (Signed)
 Requested Prescriptions  Pending Prescriptions Disp Refills   rosuvastatin  (CRESTOR ) 5 MG tablet [Pharmacy Med Name: ROSUVASTATIN  CALCIUM  5 MG TAB] 90 tablet 1    Sig: TAKE 1 TABLET (5 MG TOTAL) BY MOUTH DAILY.     Cardiovascular:  Antilipid - Statins 2 Failed - 03/21/2024  2:16 PM      Failed - Lipid Panel in normal range within the last 12 months    Cholesterol, Total  Date Value Ref Range Status  01/16/2024 283 (H) 100 - 199 mg/dL Final   LDL Chol Calc (NIH)  Date Value Ref Range Status  01/16/2024 201 (H) 0 - 99 mg/dL Final   HDL  Date Value Ref Range Status  01/16/2024 65 >39 mg/dL Final   Triglycerides  Date Value Ref Range Status  01/16/2024 101 0 - 149 mg/dL Final         Passed - Cr in normal range and within 360 days    Creatinine  Date Value Ref Range Status  03/07/2024 0.68 0.44 - 1.00 mg/dL Final         Passed - Patient is not pregnant      Passed - Valid encounter within last 12 months    Recent Outpatient Visits           2 months ago Pure hypercholesterolemia   [facility] Primary Care & [facility] Medicine at [facility], Toribio SQUIBB, [place]       Future Appointments             In 3 months Manya, Toribio SQUIBB, PA [facility] Primary Care & [facility] Medicine at [facility], [facility]

## 2024-04-06 ENCOUNTER — Encounter: Payer: Self-pay | Admitting: Oncology

## 2024-04-06 ENCOUNTER — Inpatient Hospital Stay: Attending: Oncology | Admitting: Oncology

## 2024-04-06 VITALS — BP 139/90 | HR 76 | Temp 97.1°F | Resp 16 | Wt 189.0 lb

## 2024-04-06 DIAGNOSIS — Z803 Family history of malignant neoplasm of breast: Secondary | ICD-10-CM | POA: Diagnosis not present

## 2024-04-06 DIAGNOSIS — D72820 Lymphocytosis (symptomatic): Secondary | ICD-10-CM | POA: Insufficient documentation

## 2024-04-06 DIAGNOSIS — Z800 Family history of malignant neoplasm of digestive organs: Secondary | ICD-10-CM | POA: Diagnosis not present

## 2024-04-06 NOTE — Progress Notes (Signed)
 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  Labs are reviewed and discussed with patient. Normal LDH,  peripheral flowcytometry showed increased CD4+ and CD8+ lymphocytes, indicating reactive process. negative hepatitis and HIV, no M protein on SPEP No intervention needed  No orders of the defined types were placed in this encounter.   All questions were answered. The patient knows to call the clinic with any problems, questions or concerns.  Zelphia Cap, MD, PhD [facility] Hematology Oncology 04/06/2024   HISTORY OF PRESENTING ILLNESS:   Virginia Pruitt is a  58 y.o.  female with PMH listed below was seen in consultation at the request of  Manya Toribio SQUIBB, [place]  for evaluation of leukocytosis.  Patient was noticed to have lymphocytosis since 2024.Denies weight loss, fever, chills, fatigue, night sweats.  Denies smoking or alcohol use. She feels pretty well at her baseline health state.  Denies any chronic wound, dental cavities.  Denies any prosthetic joints, implants or IUD. Denies any steroid use.  INTERVAL HISTORY Virginia Pruitt is a 58 y.o. female who has above history reviewed by me today presents for follow up visit for lymphocytosis.  She presents to discuss results. No new complaints.   MEDICAL HISTORY:  Past Medical History:  Diagnosis Date   Family history of breast cancer     SURGICAL HISTORY: Past Surgical History:  Procedure Laterality Date   COLONOSCOPY WITH PROPOFOL  N/A 03/14/2015   Procedure: COLONOSCOPY WITH PROPOFOL ;  Surgeon: Rogelia Copping, MD;  Location: [facility];  Service: Endoscopy;  Laterality: N/A;   POLYPECTOMY  03/14/2015   Procedure: POLYPECTOMY INTESTINAL;  Surgeon: Rogelia Copping,  MD;  Location: [facility];  Service: Endoscopy;;  sigmoid colon polyp   vaginal polyp removed      SOCIAL HISTORY: Social History   Socioeconomic History   Marital status: Married    Spouse name: Not on file   Number of children: 3   Years of education: Not on file   Highest education level: Associate degree: occupational, Scientist, product/process development, or vocational program  Occupational History   Not on file  Tobacco Use   Smoking status: Never   Smokeless tobacco: Never  Vaping Use   Vaping status: Never Used  Substance and Sexual Activity   Alcohol use: No   Drug use: Never   Sexual activity: Not Currently    Birth control/protection: Post-menopausal  Other Topics Concern   Not on file  Social History Narrative   Not on file   Social Drivers of Health   Financial Resource Strain: Low Risk  (03/07/2024)   Overall Financial Resource Strain (CARDIA)    Difficulty of Paying Living Expenses: Not very hard  Food Insecurity: No Food Insecurity (03/07/2024)   Hunger Vital Sign    Worried About Running Out of Food in the Last Year: Never true    Ran Out of Food in the Last Year: Never true  Transportation Needs: No Transportation Needs (03/07/2024)   PRAPARE - Administrator, Civil Service (Medical): No    Lack of Transportation (Non-Medical): No  Physical Activity: Sufficiently Active (01/12/2024)   Exercise Vital Sign    Days of Exercise per Week: 4 days    Minutes of Exercise per Session:  90 min  Stress: No Stress Concern Present (03/07/2024)   [employer] of Occupational Health - Occupational Stress Questionnaire    Feeling of Stress: Only a little  Social Connections: Socially Integrated (01/12/2024)   Social Connection and Isolation Panel    Frequency of Communication with Friends and Family: More than three times a week    Frequency of Social Gatherings with Friends and Family: Twice a week    Attends Religious Services: More than 4 times per year    Active  Member of [employer] or Organizations: Yes    Attends Engineer, structural: More than 4 times per year    Marital Status: Married  Catering manager Violence: Not At Risk (03/07/2024)   Humiliation, Afraid, Rape, and Kick questionnaire    Fear of Current or Ex-Partner: No    Emotionally Abused: No    Physically Abused: No    Sexually Abused: No    FAMILY HISTORY: Family History  Problem Relation Age of Onset   Hypertension Mother    Breast cancer Sister 18       BRCA neg per pt   Colon cancer Sister     ALLERGIES:  is allergic to amoxicillin.  MEDICATIONS:  Current Outpatient Medications  Medication Sig Dispense Refill   rosuvastatin  (CRESTOR ) 5 MG tablet TAKE 1 TABLET (5 MG TOTAL) BY MOUTH DAILY. 90 tablet 1   No current facility-administered medications for this visit.    Review of Systems  Constitutional:  Negative for appetite change, chills, fatigue and fever.  HENT:   Negative for hearing loss and voice change.   Eyes:  Negative for eye problems.  Respiratory:  Negative for chest tightness and cough.   Cardiovascular:  Negative for chest pain.  Gastrointestinal:  Negative for abdominal distention, abdominal pain and blood in stool.  Endocrine: Negative for hot flashes.  Genitourinary:  Negative for difficulty urinating and frequency.   Musculoskeletal:  Negative for arthralgias.  Skin:  Negative for itching and rash.  Neurological:  Negative for extremity weakness.  Hematological:  Negative for adenopathy.  Psychiatric/Behavioral:  Negative for confusion.    PHYSICAL EXAMINATION:  Vitals:   04/06/24 1030  BP: (!) 139/90  Pulse: 76  Resp: 16  Temp: (!) 97.1 F (36.2 C)  SpO2: 100%   Filed Weights   04/06/24 1030  Weight: 189 lb (85.7 kg)    Physical Exam Constitutional:      General: She is not in acute distress. HENT:     Head: Normocephalic and atraumatic.  Eyes:     General: No scleral icterus. Cardiovascular:     Rate and Rhythm: Normal  rate and regular rhythm.     Heart sounds: Normal heart sounds.  Pulmonary:     Effort: Pulmonary effort is normal. No respiratory distress.     Breath sounds: No wheezing.  Abdominal:     General: Bowel sounds are normal. There is no distension.     Palpations: Abdomen is soft.  Musculoskeletal:        General: No deformity. Normal range of motion.     Cervical back: Normal range of motion and neck supple.  Skin:    General: Skin is warm and dry.     Findings: No erythema or rash.  Neurological:     Mental Status: She is alert and oriented to person, place, and time. Mental status is at baseline.     Cranial Nerves: No cranial nerve deficit.     Coordination: Coordination normal.  Psychiatric:        Mood and Affect: Mood normal.     LABORATORY DATA:  I have reviewed the data as listed    Latest Ref Rng & Units 03/07/2024    9:58 AM 02/20/2024    1:19 PM 01/16/2024    9:07 AM  CBC  WBC 4.0 - 10.5 K/uL 8.9  10.9  9.8   Hemoglobin 12.0 - 15.0 g/dL 87.0  88.0  87.0   Hematocrit 36.0 - 46.0 % 39.9  37.5  39.8   Platelets 150 - 400 K/uL 295  408  434       Latest Ref Rng & Units 03/07/2024    9:58 AM 07/06/2023    9:38 AM  CMP  Glucose 70 - 99 mg/dL 97  86   BUN 6 - 20 mg/dL 12  11   Creatinine 9.55 - 1.00 mg/dL 9.31  9.25   Sodium 864 - 145 mmol/L 137  141   Potassium 3.5 - 5.1 mmol/L 3.7  4.3   Chloride 98 - 111 mmol/L 105  103   CO2 22 - 32 mmol/L 23  25   Calcium  8.9 - 10.3 mg/dL 9.3  9.6   Total Protein 6.5 - 8.1 g/dL 7.9  6.9   Total Bilirubin 0.0 - 1.2 mg/dL 0.8  0.2   Alkaline Phos 38 - 126 U/L 59  75   AST 15 - 41 U/L 21  20   ALT 0 - 44 U/L 17  15       RADIOGRAPHIC STUDIES: I have personally reviewed the radiological images as listed and agreed with the findings in the report. No results found.

## 2024-04-06 NOTE — Assessment & Plan Note (Addendum)
 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  Labs are reviewed and discussed with patient. Normal LDH,  peripheral flowcytometry showed increased CD4+ and CD8+ lymphocytes, indicating reactive process. negative hepatitis and HIV, no M protein on SPEP No intervention needed

## 2024-07-17 ENCOUNTER — Ambulatory Visit (INDEPENDENT_AMBULATORY_CARE_PROVIDER_SITE_OTHER): Admitting: Physician Assistant

## 2024-07-17 ENCOUNTER — Encounter: Payer: Self-pay | Admitting: Physician Assistant

## 2024-07-17 VITALS — BP 132/86 | HR 79 | Temp 98.4°F | Ht 68.5 in | Wt 193.0 lb

## 2024-07-17 DIAGNOSIS — R030 Elevated blood-pressure reading, without diagnosis of hypertension: Secondary | ICD-10-CM

## 2024-07-17 DIAGNOSIS — E7800 Pure hypercholesterolemia, unspecified: Secondary | ICD-10-CM

## 2024-07-17 DIAGNOSIS — Z0000 Encounter for general adult medical examination without abnormal findings: Secondary | ICD-10-CM

## 2024-07-17 DIAGNOSIS — Z23 Encounter for immunization: Secondary | ICD-10-CM

## 2024-07-17 MED ORDER — ROSUVASTATIN CALCIUM 5 MG PO TABS: 5.0000 mg | ORAL_TABLET | Freq: Every day | ORAL | 1 refills | Status: AC

## 2024-07-17 NOTE — Patient Instructions (Addendum)
-  It was a pleasure to see you today! Please review your visit summary for helpful information -Lab results are usually available within 1-2 days and we will call once reviewed -I would encourage you to follow your care via MyChart where you can access lab results, notes, messages, and more -If you feel that we did a nice job today, please complete your after-visit survey and leave [place] a Google review! Your CMA today was [place] and your provider was Alvester Morin, PA-C, DMSc

## 2024-07-17 NOTE — Progress Notes (Signed)
 Date:  07/17/2024   Name:  Virginia Pruitt   DOB:  [birth year]   MRN:  [id number]   Chief Complaint: Annual Exam and Hyperlipidemia (Only took medication for 2 months )  HPI  [name] returns to clinic today for routine physical with no partiular complaints. Woke with a mild headache today. Overall feels well.   We have been monitoring her lymphocytosis which was increasing up until this summer where it normalized following her hematology consult. She was advised to f/u with PCP.   We have also been following her severe HLD with LDL >200. She started rosuvastatin  in June, but stopped it mid Oct despite good tolerance because my blood work from hematology was normal. Hematology did not check lipids. Lipids have not been checked since medication initiation in June.   She has a GYN, so breast and pelvic exams will be deferred today.   Last Physical: 07/06/23 Last Dental Exam: Apr 2025 Last Eye Exam: Early 2025 Last CRC screen: colonoscopy 03/14/15 repeat 10y  Last Mammo: 12/28/23 normal Last Pap: 10/15/20 NILM, neg HPV Immunizations Due: Shingrix, Prevnar 20    Medication list has been reviewed and updated.  Current Meds  Medication Sig   [DISCONTINUED] rosuvastatin  (CRESTOR ) 5 MG tablet TAKE 1 TABLET (5 MG TOTAL) BY MOUTH DAILY.     Review of Systems  Patient Active Problem List   Diagnosis Date Noted   Lymphocytosis 03/07/2024   Family history of cancer 03/07/2024   Cardiac murmur 03/07/2024   Pure hypercholesterolemia 07/11/2023   Benign neoplasm of sigmoid colon     Allergies  Allergen Reactions   Amoxicillin Rash    Immunization History  Administered Date(s) Administered   Influenza,inj,Quad PF,6+ Mos 06/15/2021   Influenza-Unspecified 05/19/2023, 05/17/2024   PFIZER(Purple Top)SARS-COV-2 Vaccination 10/25/2019, 11/15/2019, 12/31/2020   PNEUMOCOCCAL CONJUGATE-20 07/17/2024   Pfizer Covid-19 Vaccine Bivalent Booster 75yrs & up 06/16/2021   Tdap 10/15/2020     Past Surgical History:  Procedure Laterality Date   COLONOSCOPY WITH PROPOFOL  N/A 03/14/2015   Procedure: COLONOSCOPY WITH PROPOFOL ;  Surgeon: Rogelia Copping, MD;  Location: [facility];  Service: Endoscopy;  Laterality: N/A;   POLYPECTOMY  03/14/2015   Procedure: POLYPECTOMY INTESTINAL;  Surgeon: Rogelia Copping, MD;  Location: [facility];  Service: Endoscopy;;  sigmoid colon polyp   vaginal polyp removed      Social History   Tobacco Use   Smoking status: Never   Smokeless tobacco: Never  Vaping Use   Vaping status: Never Used  Substance Use Topics   Alcohol use: Not Currently    Comment: stopped drinking in 1999   Drug use: Never    Family History  Problem Relation Age of Onset   Hypertension Mother    Breast cancer Sister 75       BRCA neg per pt   Colon cancer Sister         07/17/2024   11:11 AM 01/16/2024    8:33 AM 08/18/2023    8:19 AM 07/06/2023    8:39 AM  GAD 7 : Generalized Anxiety Score  Nervous, Anxious, on Edge 0 0 0 0  Control/stop worrying 0 0 0 0  Worry too much - different things 0 0 0 0  Trouble relaxing 0 0 0 0  Restless 0 0 0 0  Easily annoyed or irritable 0 0 0 0  Afraid - awful might happen 0 0 0 0  Total GAD 7 Score 0 0 0 0  Anxiety  Difficulty Not difficult at all Not difficult at all Not difficult at all Not difficult at all       07/17/2024   11:11 AM 04/06/2024   10:30 AM 03/07/2024   11:00 AM  Depression screen PHQ 2/9  Decreased Interest 0 0 0  Down, Depressed, Hopeless 0 0 0  PHQ - 2 Score 0 0 0    BP Readings from Last 3 Encounters:  07/17/24 132/86  04/06/24 (!) 139/90  03/07/24 (!) 143/86    Wt Readings from Last 3 Encounters:  07/17/24 193 lb (87.5 kg)  04/06/24 189 lb (85.7 kg)  03/07/24 188 lb (85.3 kg)    BP 132/86 (Cuff Size: Large)   Pulse 79   Temp 98.4 F (36.9 C)   Ht 5' 8.5 (1.74 m)   Wt 193 lb (87.5 kg)   SpO2 98%   BMI 28.92 kg/m   Physical Exam Vitals and nursing note reviewed.   Constitutional:      Appearance: Normal appearance. She is well-groomed.  HENT:     Ears:     Comments: EAC clear bilaterally with good view of TM which is without effusion or erythema.     Nose: Nose normal.     Mouth/Throat:     Mouth: Mucous membranes are moist. No oral lesions.     Dentition: Normal dentition.     Pharynx: Uvula midline. No posterior oropharyngeal erythema.  Eyes:     General: Vision grossly intact.     Extraocular Movements: Extraocular movements intact.     Conjunctiva/sclera: Conjunctivae normal.     Pupils: Pupils are equal, round, and reactive to light.  Neck:     Thyroid: No thyroid mass or thyromegaly.  Cardiovascular:     Rate and Rhythm: Normal rate and regular rhythm.     Heart sounds: S1 normal and S2 normal. No murmur heard.    No friction rub. No gallop.     Comments: Pulses 2+ at radial, PT, DP bilaterally. No carotid bruit. No peripheral edema Pulmonary:     Effort: Pulmonary effort is normal.     Breath sounds: Normal breath sounds.  Abdominal:     General: Bowel sounds are normal.     Palpations: Abdomen is soft. There is no mass.     Tenderness: There is no abdominal tenderness.  Musculoskeletal:     Comments: Full ROM with strength 5/5 bilateral upper and lower extremities  Lymphadenopathy:     Cervical: No cervical adenopathy.  Skin:    General: Skin is warm.     Capillary Refill: Capillary refill takes less than 2 seconds.     Findings: No lesion or rash.  Neurological:     Mental Status: She is alert and oriented to person, place, and time.     Cranial Nerves: Cranial nerves 2-12 are intact.     Gait: Gait is intact.  Psychiatric:        Mood and Affect: Mood and affect normal.        Behavior: Behavior normal.     Recent Labs     Component Value Date/Time   NA 137 03/07/2024 0958   NA 141 07/06/2023 0938   K 3.7 03/07/2024 0958   CL 105 03/07/2024 0958   CO2 23 03/07/2024 0958   GLUCOSE 97 03/07/2024 0958   BUN 12  03/07/2024 0958   BUN 11 07/06/2023 0938   CREATININE 0.68 03/07/2024 0958   CALCIUM  9.3 03/07/2024 0958   PROT 7.9  03/07/2024 0958   PROT 6.9 07/06/2023 0938   ALBUMIN 4.2 03/07/2024 0958   ALBUMIN 4.1 07/06/2023 0938   AST 21 03/07/2024 0958   ALT 17 03/07/2024 0958   ALKPHOS 59 03/07/2024 0958   BILITOT 0.8 03/07/2024 0958   GFRNONAA >60 03/07/2024 0958    Lab Results  Component Value Date   WBC 8.9 03/07/2024   HGB 12.9 03/07/2024   HCT 39.9 03/07/2024   MCV 86.4 03/07/2024   PLT 295 03/07/2024   No results found for: HGBA1C Lab Results  Component Value Date   CHOL 283 (H) 01/16/2024   HDL 65 01/16/2024   LDLCALC 201 (H) 01/16/2024   TRIG 101 01/16/2024   CHOLHDL 4.4 01/16/2024   Lab Results  Component Value Date   TSH 0.926 07/06/2023      Assessment and Plan:  1. Annual physical exam (Primary) Encouraged healthy lifestyle including regular physical activity and consumption of whole fruits and vegetables. Encouraged routine dental and eye exams.   2. Pure hypercholesterolemia Refill rosuvastatin .  Will plan to recheck lipids along with routine labs at a lab visit in a couple months. Patient to consider free genetic screening with [facility] program.  - rosuvastatin  (CRESTOR ) 5 MG tablet; Take 1 tablet (5 mg total) by mouth daily.  Dispense: 90 tablet; Refill: 1  3. Borderline hypertension Reviewed the Seven S's of hypertension including soil (encouraged plant-forward whole food eating habits), salt (intake <2g), smoking, stimulants (e.g. caffeine), stress, sleep (quantity, quality, timing, snoring/OSA), and sedentary lifestyle (aim for 150 active minutes per week).   4. Encounter for immunization Prevnar 20 administered today.  Patient would like to return for separate visits for Shingrix #1 and #2 so we have scheduled nurse visits for these in the coming months. - Pneumococcal conjugate vaccine 20-valent     F/u in 1 month Shingrix #1 F/u in 3 mos  Shingrix #2, labs F/u in 6 mo fasting OV HLD  F/u in 1y CPE   Rolan Hoyle, PA-C, DMSc, Nutritionist [facility] [phone]

## 2024-08-17 ENCOUNTER — Ambulatory Visit (INDEPENDENT_AMBULATORY_CARE_PROVIDER_SITE_OTHER)

## 2024-08-17 DIAGNOSIS — Z23 Encounter for immunization: Secondary | ICD-10-CM | POA: Diagnosis not present

## 2024-08-17 NOTE — Progress Notes (Signed)
 Patient is in office today for a nurse visit for Immunization. Patient Injection was given in the  Right deltoid. Patient tolerated injection well.

## 2024-10-19 ENCOUNTER — Ambulatory Visit

## 2025-01-15 ENCOUNTER — Ambulatory Visit: Admitting: Physician Assistant

## 2025-07-18 ENCOUNTER — Encounter: Admitting: Physician Assistant
